# Patient Record
Sex: Male | Born: 1965 | Race: Black or African American | Hispanic: No | Marital: Single | Smoking: Current every day smoker
Health system: Southern US, Community
[De-identification: ages and names within clinical notes are randomized; demographics above are authoritative.]

## PROBLEM LIST (undated history)

## (undated) DIAGNOSIS — N200 Calculus of kidney: Secondary | ICD-10-CM

## (undated) DIAGNOSIS — E7800 Pure hypercholesterolemia, unspecified: Secondary | ICD-10-CM

## (undated) DIAGNOSIS — G8929 Other chronic pain: Secondary | ICD-10-CM

## (undated) DIAGNOSIS — I10 Essential (primary) hypertension: Secondary | ICD-10-CM

## (undated) HISTORY — DX: Other chronic pain: G89.29

## (undated) HISTORY — PX: BACK SURGERY: SHX140

## (undated) HISTORY — DX: Calculus of kidney: N20.0

---

## 2014-02-19 ENCOUNTER — Encounter (HOSPITAL_COMMUNITY): Payer: Self-pay | Admitting: Emergency Medicine

## 2014-02-19 ENCOUNTER — Emergency Department (HOSPITAL_COMMUNITY)
Admission: EM | Admit: 2014-02-19 | Discharge: 2014-02-19 | Disposition: A | Discharge status: Home / Self Care | Payer: Self-pay | Attending: Emergency Medicine | Admitting: Emergency Medicine

## 2014-02-19 DIAGNOSIS — S0180XA Unspecified open wound of other part of head, initial encounter: Secondary | ICD-10-CM | POA: Insufficient documentation

## 2014-02-19 DIAGNOSIS — F17200 Nicotine dependence, unspecified, uncomplicated: Secondary | ICD-10-CM | POA: Insufficient documentation

## 2014-02-19 DIAGNOSIS — IMO0002 Reserved for concepts with insufficient information to code with codable children: Secondary | ICD-10-CM | POA: Insufficient documentation

## 2014-02-19 DIAGNOSIS — Y929 Unspecified place or not applicable: Secondary | ICD-10-CM | POA: Insufficient documentation

## 2014-02-19 DIAGNOSIS — S0181XA Laceration without foreign body of other part of head, initial encounter: Secondary | ICD-10-CM

## 2014-02-19 DIAGNOSIS — S01511A Laceration without foreign body of lip, initial encounter: Secondary | ICD-10-CM

## 2014-02-19 DIAGNOSIS — S01501A Unspecified open wound of lip, initial encounter: Secondary | ICD-10-CM | POA: Insufficient documentation

## 2014-02-19 DIAGNOSIS — Y939 Activity, unspecified: Secondary | ICD-10-CM | POA: Insufficient documentation

## 2014-02-19 DIAGNOSIS — Z23 Encounter for immunization: Secondary | ICD-10-CM | POA: Insufficient documentation

## 2014-02-19 DIAGNOSIS — Y990 Civilian activity done for income or pay: Secondary | ICD-10-CM | POA: Insufficient documentation

## 2014-02-19 MED ORDER — TETANUS-DIPHTH-ACELL PERTUSSIS 5-2.5-18.5 LF-MCG/0.5 IM SUSP
0.5000 mL | Freq: Once | INTRAMUSCULAR | Status: AC
  Administered 2014-02-19: 0.5 mL via INTRAMUSCULAR
  Filled 2014-02-19: qty 0.5

## 2014-02-19 MED ORDER — NAPROXEN 500 MG PO TABS: 500.0000 mg | ORAL_TABLET | Freq: Two times a day (BID) | ORAL | Status: DC

## 2014-02-19 MED ORDER — OXYCODONE-ACETAMINOPHEN 5-325 MG PO TABS: 1.0000 | ORAL_TABLET | Freq: Four times a day (QID) | ORAL | Status: DC | PRN

## 2014-02-19 NOTE — Discharge Instructions (Signed)
Please read and follow all provided instructions.  Your diagnoses today include:  1. Facial laceration   2. Lip laceration     Tests performed today include:  Vital signs. See below for your results today.   Medications prescribed:   Percocet (oxycodone/acetaminophen) - narcotic pain medication  DO NOT drive or perform any activities that require you to be awake and alert because this medicine can make you drowsy. BE VERY CAREFUL not to take multiple medicines containing Tylenol (also called acetaminophen). Doing so can lead to an overdose which can damage your liver and cause liver failure and possibly death.   Naproxen - anti-inflammatory pain medication  Do not exceed 500mg  naproxen every 12 hours, take with food  You have been prescribed an anti-inflammatory medication or NSAID. Take with food. Take smallest effective dose for the shortest duration needed for your pain. Stop taking if you experience stomach pain or vomiting.   Take any prescribed medications only as directed.   Home care instructions:  Follow any educational materials and wound care instructions contained in this packet.   Keep affected area above the level of your heart when possible to minimize swelling. Wash area gently twice a day with warm soapy water. Do not apply alcohol or hydrogen peroxide. Cover the area if it draining or weeping.   Follow-up instructions: Suture Removal: Return to the Emergency Department or see your primary care care doctor in 5-7 days for a recheck of your wound and removal of your sutures or staples.    If you do not have a primary care doctor -- see below for referral information.   Return instructions:  Return to the Emergency Department if you have:  Fever  Worsening pain  Worsening swelling of the wound  Pus draining from the wound  Redness of the skin that moves away from the wound, especially if it streaks away from the affected area   Any other emergent  concerns  Your vital signs today were: BP 161/102   Pulse 77   Temp(Src) 98 F (36.7 C) (Oral)   Resp 18   Wt 210 lb 3.2 oz (95.346 kg)   SpO2 98% If your blood pressure (BP) was elevated above 135/85 this visit, please have this repeated by your doctor within one month. --------------  Emergency Department Resource Guide 1) Find a Doctor and Pay Out of Pocket Although you won't have to find out who is covered by your insurance plan, it is a good idea to ask around and get recommendations. You will then need to call the office and see if the doctor you have chosen will accept you as a new patient and what types of options they offer for patients who are self-pay. Some doctors offer discounts or will set up payment plans for their patients who do not have insurance, but you will need to ask so you aren't surprised when you get to your appointment.  2) Contact Your Local Health Department Not all health departments have doctors that can see patients for sick visits, but many do, so it is worth a call to see if yours does. If you don't know where your local health department is, you can check in your phone book. The CDC also has a tool to help you locate your state's health department, and many state websites also have listings of all of their local health departments.  3) Find a Walk-in Clinic If your illness is not likely to be very severe or complicated, you  may want to try a walk in clinic. These are popping up all over the country in pharmacies, drugstores, and shopping centers. They're usually staffed by nurse practitioners or physician assistants that have been trained to treat common illnesses and complaints. They're usually fairly quick and inexpensive. However, if you have serious medical issues or chronic medical problems, these are probably not your best option.  No Primary Care Doctor: - Call Health Connect at  [phone] - they can help you locate a primary care doctor that  accepts your  insurance, provides certain services, etc. - Physician Referral Service- [phone]  Chronic Pain Problems: Organization         Address  Phone   Notes  [facility]  [phone] Patients need to be referred by their primary care doctor.   Medication Assistance: Organization         Address  Phone   Notes  [facility] [address] [place], [place] [address] [phone] --Must be a resident of [facility] -- Must have NO insurance coverage whatsoever (no Medicaid/ Medicare, etc.) -- The pt. MUST have a primary care doctor that directs their care regularly and follows them in the community   [facility]ist  [phone]   [employer]  [phone]    Agencies that provide inexpensive medical care: Organization         Address  Phone   Notes  [place]  [phone]   [facility]    [phone]   [facility] [place], [place] [address] [phone]   [facility] [address], [place] [phone]   Planned Parenthood    [phone]   [facility]    [phone]   [address], [place] Phone:  [phone], Fax:  [phone] Hours of Operation:  9 am - 6 pm, M-F.  Also accepts Medicaid/Medicare and self-pay.  [facility] for [place], [address], [place] Phone: [phone], Fax: [phone]. Hours of Operation:  8:30 am - 5:30 pm, M-F.  Also accepts Medicaid and self-pay.  [facility] [place] [address], [place] Phone: [phone]   [place], [place], [place] [phone], Ext. [phone] Mondays & Thursdays: 7-9 AM.  First 15 patients are seen on a first come, first serve basis.    [place] Providers:  Organization          Address  Phone   Notes  [facility] [address], [place] [phone] Also accepts self-pay patients.  [facility] [id number] [place], [place]  [phone]   [place], [address], [place] [phone]   [facility] [address], [place] [phone]   Lucianne Lei [address], [place]   [phone] Only accepts [place] patients after they have their name applied to their card.   Self-Pay (no insurance) in [facility]:  Organization         Address  Phone   Notes  Sickle Cell Patients, [facility] [place] [phone]   [facility] [place] (  [phone]) [phone]   [facility] [place]  [address], [place] [phone]) [phone]   [facility]/Dr. Osei-Bonsu  [address] [place] [address], [place] or [address], [place] [phone]) [phone] Phone number for both [place] and [place] locations is the same.  [facility] [address], [place] [phone]) [phone]   [facility] [place] [address] [place] [address], [place] or [address] [phone]) [phone]) [phone]   [facility] [address], [place] [phone]) [phone], phone; [phone](336) [phone], fax Sees patients 1st and 3rd Saturday of every month.  Must not qualify for public or private insurance (i.e. Medicaid, Medicare, [place] Health Choice, Veterans' Benefits)  Household income should be no more than 200% of the poverty level The clinic cannot treat you if you are pregnant or think you are pregnant  Sexually transmitted diseases are not treated at the clinic.    Dental Care: Organization         Address  Phone  Notes  [facility] [address],  [place] [phone]) [phone] Accepts children up to age 47 who are enrolled in [place] or [place] Health Choice; pregnant women with a Medicaid card; and children who have applied for Medicaid or [place] Health Choice, but were declined, whose parents can pay a reduced fee at time of service.  [facility] [place]  [address], [place] [phone]) [phone] Accepts children up to age 47 who are enrolled in [place] or [place] Health Choice; pregnant women with a Medicaid card; and children who have applied for Medicaid or [place] Health Choice, but were declined, whose parents can pay a reduced fee at time of service.  [facility]  [address], [place] [phone]) [phone] Patients are seen by appointment only. Walk-ins are not accepted. [facility] will see patients 47 years of age and older. Monday - Tuesday (8am-5pm) Most Wednesdays (8:30-5pm) $30 per visit, cash only  [facility]  [address], [facility] [phone]) [phone] Patients are seen by appointment only. Walk-ins are not accepted. [facility] will see patients 10518 years of age and older. One Wednesday Evening (Monthly: Volunteer Based).  $30 per visit, cash only  [employer] [facility]  [phone] for adults; Children under age 244, call Graduate Pediatric Dentistry at [phone]) [phone]. Children aged 194-14, please call [phone] to request a pediatric application.  Dental services are provided in all areas of dental care including fillings, crowns and bridges, complete and partial dentures, implants, gum treatment, root canals, and extractions. Preventive care is also provided. Treatment is provided to both adults and children. Patients are selected via a lottery and there is often a waiting list.   [facility] [address], [place]  [phone]) [phone] [email or web address]   [facility] [address], [place], [place]  [phone], Ext. [phone] Second and Fourth Thursday of each month, opens at 6:30 AM; Clinic ends at 9 AM.  Patients are seen on a first-come first-served basis, and a limited number are seen during each clinic.   [facility]  [address], [place], [place] [phone]) [phone]   Eligibility Requirements You must have lived in [place], [place], or [place] counties for at least the last three months.   You cannot be eligible for  state or Teacher, music, including [employer], [place], or [employer].   You generally cannot be eligible for healthcare insurance through your employer.    How to apply: Eligibility screenings are held every Tuesday and Wednesday afternoon from 1:00 pm until 4:00 pm. You do not need an appointment for the interview!  [facility] [address] [place], [place] [phone]   [facility] Health Department  [phone]   [facility]  [phone]   [facility] Health Department  [phone]    Behavioral Health Resources in the Community: Intensive Outpatient Programs Organization         Address  Phone  Notes  [facility] Services [address] [place], [place] [phone]   [facility] [address], [place], [place] [phone]   [facility] [address], [place], [place]  [phone]   [facility] [address]  [place], [place] [phone] or [phone]   Substance Abuse Resources Organization         Address  Phone  Notes  Alcohol and Drug Services  [phone]   [facility]  [phone]   [facility] [place]  [phone]   Floydene Flock  [phone]   Residential & Outpatient Substance Abuse Program  [phone]   Psychological Services Organization         Address  Phone  Notes  [facility]  [phone]   [facility]  [phone]    [facility] [address] [place] [phone] or [phone]    Mobile Crisis Teams Organization         Address  Phone  Notes  [facility], Mobile Crisis Care Unit  [phone]   [facility]  [address] [place], [place] [phone]   Doristine Locks [address] [place] [phone]    Self-Help/Support Groups Organization         Address  Phone             Notes  [facility] [place] - variety of support groups  [phone]- I7437963 Call for more information  Narcotics Anonymous (NA), Caring [facility] [address], [employer] [place]  2 meetings at this location   Statistician         Address  Phone  Notes  [facility] [address],    [place]  [phone]   [facility]  [address] [place] [address], [place], [place] [phone]   [facility] [address] [place], [place] [phone] Admissions: 8am-3pm M-F  [facility] [address],    [place], [place] [phone]   [facility] [address] [place], [place] [phone]   [facility] [address]  [place], [place] [phone]   [facility] - Intensive Outpatient [address], [place], [place] [phone]   [facility] ([facility] [address] [place].,  [place], [place] [phone] or [phone]   [facility] Services (RTS) [address] [place], [place] [phone] Accepts Medicaid  [facility] [place] [address],  [place] [phone] Substance Abuse/Addiction Treatment   [facility] Organization         Address  Phone  Notes  [facility]  [phone]   Angie Fava, PhD [address] [place], [place]   (  [phone]) [phone] or [phone]) [phone]   [facility]   [address] [place], [place] [phone](336) [phone]   [facility] [address], [place], [place] [phone]) [phone] Insurance/Medicaid/sponsorship through [facility]  [facility] Families [address]                                    [place], [place] [phone]) [phone] Therapy/tele-psych/case  [facility] [address]   [place], [place] [phone]) [phone]    Dr. Lolly MustacheArfeen  [phone]) [phone]   [facility] [place]  [employer] [facility] 1) [address], [place] 2) [address], [place] 3)  [address], [place] [phone]) [phone]) [phone]  [phone](336) [phone]   [facility] Child Abuse Hotline [phone](336) 7745132976 or [phone](336) [phone] (After Hours)

## 2014-02-19 NOTE — ED Provider Notes (Signed)
CSN: [id number]     Arrival date & time 02/19/14  1354 History   First MD Initiated Contact with Patient 02/19/14 1402    This char t was scribed for Jeremy Smith, a non-physician practitioner working with Flint Melter, MD by Lewanda Rife, ED Scribe. This patient was seen in room TR06C/TR06C and the patient's care was started at 2:34 PM     Chief Complaint  Patient presents with  . Lip Laceration   (Consider location/radiation/quality/duration/timing/severity/associated sxs/prior Treatment) The history is provided by the patient. No language interpreter was used.   HPI Comments: Jeremy Smith is a 48 y.o. male who presents to the Emergency Department complaining of lip laceration onset acute PTA after a floor board struck him in the face. Reports constant moderate pain to site. Denies any aggravating or alleviating factors. Denies associated neck pain, LOC, head injury, headaches, and numbness. Tetanus status is not up to date.    History reviewed. No pertinent past medical history. Past Surgical History  Procedure Laterality Date  . Back surgery     History reviewed. No pertinent family history. History  Substance Use Topics  . Smoking status: Current Every Day Smoker  . Smokeless tobacco: Not on file  . Alcohol Use: No    Review of Systems  Constitutional: Negative for fever and fatigue.  HENT: Negative for tinnitus.   Eyes: Negative for photophobia, pain and visual disturbance.  Respiratory: Negative for shortness of breath.   Cardiovascular: Negative for chest pain.  Gastrointestinal: Negative for nausea and vomiting.  Musculoskeletal: Negative for back pain, gait problem and neck pain.  Skin: Positive for wound.  Neurological: Negative for dizziness, weakness, light-headedness, numbness and headaches.  Psychiatric/Behavioral: Negative for confusion and decreased concentration.    Allergies  Review of patient's allergies indicates no known allergies.  Home  Medications   Current Outpatient Rx  Name  Route  Sig  Dispense  Refill  . naproxen (NAPROSYN) 500 MG tablet   Oral   Take 1 tablet (500 mg total) by mouth 2 (two) times daily.   20 tablet   0   . oxyCODONE-acetaminophen (PERCOCET/ROXICET) 5-325 MG per tablet   Oral   Take 1-2 tablets by mouth every 6 (six) hours as needed for severe pain.   12 tablet   0    BP 161/102  Pulse 77  Temp(Src) 98 F (36.7 C) (Oral)  Resp 18  Wt 210 lb 3.2 oz (95.346 kg)  SpO2 98% Physical Exam  Nursing note and vitals reviewed. Constitutional: He is oriented to person, place, and time. He appears well-developed and well-nourished. No distress.  HENT:  Head: Normocephalic and atraumatic. Head is without raccoon's eyes and without Battle's sign.  Right Ear: Tympanic membrane, external ear and ear canal normal. No hemotympanum.  Left Ear: Tympanic membrane, external ear and ear canal normal. No hemotympanum.  Nose: Nose normal. No nasal septal hematoma.  Mouth/Throat: Oropharynx is clear and moist.  Full ROM jaw without pain. No point tenderness over mandible. Full ROM of facial muscles.   Eyes: Conjunctivae, EOM and lids are normal. Pupils are equal, round, and reactive to light.  No visible hyphema  Neck: Normal range of motion. Neck supple. No tracheal deviation present.  Cardiovascular: Normal rate and regular rhythm.   Pulmonary/Chest: Effort normal and breath sounds normal. No respiratory distress.  Abdominal: Soft. There is no tenderness.  Musculoskeletal: Normal range of motion. He exhibits no tenderness.       Cervical back:  He exhibits normal range of motion, no tenderness and no bony tenderness.       Thoracic back: He exhibits no tenderness and no bony tenderness.       Lumbar back: He exhibits no tenderness and no bony tenderness.  Neurological: He is alert and oriented to person, place, and time. He has normal strength and normal reflexes. No cranial nerve deficit or sensory  deficit. Coordination normal. GCS eye subscore is 4. GCS verbal subscore is 5. GCS motor subscore is 6.  Skin: Skin is warm and dry.  2cm L-shaped crossing vermilion border, not through and through, lower R lip, clean, hemostatic.  3cm crossing linear laceration, L chin, extends through subcutaneous tissue, no muscle or nerve involvement noted, clean, hemostatic.  Psychiatric: He has a normal mood and affect. His behavior is normal.    ED Course  Procedures (including critical care time) COORDINATION OF CARE:  Nursing notes reviewed. Vital signs reviewed. Initial pt interview and examination performed.   Filed Vitals:   02/19/14 1357  BP: 161/102  Pulse: 77  Temp: 98 F (36.7 C)  TempSrc: Oral  Resp: 18  Weight: 210 lb 3.2 oz (95.346 kg)  SpO2: 98%    2:14 PM-Discussed work up plan with pt at bedside. Pt agrees with plan.   LACERATION REPAIR Performed by: Rhea BleacherJosh Griselle Rufer PA-C Consent: Verbal consent obtained. Risks and benefits: risks, benefits and alternatives were discussed Patient identity confirmed: provided demographic data Time out performed prior to procedure Prepped and Draped in normal sterile fashion Wound explored Laceration Location: L-shape laceration crossing [place] border lower right lip  Laceration Length: 2 cm No Foreign Bodies seen or palpated Anesthesia: local infiltration Local anesthetic: lidocaine 2% with epinephrine Anesthetic total: 2 ml Irrigation method: syringe Amount of cleaning: standard Skin closure: prolene 5-0 Number of sutures or staples: 8 sutures Technique: simple interrupted  Patient tolerance: Patient tolerated the procedure well with no immediate complications.  LACERATION REPAIR Performed by: Rhea BleacherJosh Timara Loma PA-C Consent: Verbal consent obtained. Risks and benefits: risks, benefits and alternatives were discussed Patient identity confirmed: provided demographic data Time out performed prior to procedure Prepped and Draped in  normal sterile fashion Wound explored Laceration Location: Left chin Laceration Length: 3 cm No Foreign Bodies seen or palpated Anesthesia: local infiltration Local anesthetic: lidocaine 2% with epinephrine Anesthetic total: 4 ml Irrigation method: syringe Amount of cleaning: standard Skin closure: prolene 5-0 Number of sutures or staples: 10 sutures  Technique: simple interrupted Patient tolerance: Patient tolerated the procedure well with no immediate complications.  Wound(s) explored with adequate hemostasis, no apparent gross foreign body retained, no significant involvement of deep structures such as bone / joint / tendon / or neurovascular involvement noted. Initial diagnostic testing ordered.     Labs Review Labs Reviewed - No data to display Imaging Review No results found.   EKG Interpretation None      Patient counseled on wound care. Patient counseled on need to return or see PCP/urgent care for suture removal in 5-7 days. Patient was urged to return to the Emergency Department urgently with worsening pain, swelling, expanding erythema especially if it streaks away from the affected area, fever, or if they have any other concerns. Patient verbalized understanding.   Patient counseled on use of narcotic pain medications. Counseled not to combine these medications with others containing tylenol. Urged not to drink alcohol, drive, or perform any other activities that requires focus while taking these medications. The patient verbalizes understanding and agrees with the  plan.  Tetanus updated.    MDM   Final diagnoses:  Facial laceration  Lip laceration   Facial laceration. No closed head injury suspected. No neuro deficits. Lacerations repaired with good approximation. Do not suspect significant bony, muscle, nerve, or vasculature injury. No jaw injury suspected.    I personally performed the services described in this documentation, which was scribed in my  presence. The recorded information has been reviewed and is accurate.    Renne Crigler, PA-C 02/19/14 [phone]

## 2014-02-19 NOTE — ED Notes (Signed)
While at work, a board popped up, striking pt in the chin and mouth. Deep lac through vermilion border of right lower lip and second lac to chin.

## 2014-02-25 NOTE — ED Provider Notes (Signed)
Medical screening examination/treatment/procedure(s) were performed by non-physician practitioner and as supervising physician I was immediately available for consultation/collaboration.  Pedram Goodchild L Alaiya Martindelcampo, MD 02/25/14 0954 

## 2014-03-01 ENCOUNTER — Emergency Department (HOSPITAL_COMMUNITY)
Admission: EM | Admit: 2014-03-01 | Discharge: 2014-03-01 | Disposition: A | Discharge status: Home / Self Care | Payer: Self-pay | Attending: Emergency Medicine | Admitting: Emergency Medicine

## 2014-03-01 ENCOUNTER — Encounter (HOSPITAL_COMMUNITY): Payer: Self-pay | Admitting: Emergency Medicine

## 2014-03-01 DIAGNOSIS — R52 Pain, unspecified: Secondary | ICD-10-CM | POA: Insufficient documentation

## 2014-03-01 DIAGNOSIS — G8929 Other chronic pain: Secondary | ICD-10-CM | POA: Insufficient documentation

## 2014-03-01 DIAGNOSIS — Z4802 Encounter for removal of sutures: Secondary | ICD-10-CM | POA: Insufficient documentation

## 2014-03-01 DIAGNOSIS — M545 Low back pain, unspecified: Secondary | ICD-10-CM | POA: Insufficient documentation

## 2014-03-01 DIAGNOSIS — Z9889 Other specified postprocedural states: Secondary | ICD-10-CM | POA: Insufficient documentation

## 2014-03-01 DIAGNOSIS — Z791 Long term (current) use of non-steroidal anti-inflammatories (NSAID): Secondary | ICD-10-CM | POA: Insufficient documentation

## 2014-03-01 DIAGNOSIS — F17200 Nicotine dependence, unspecified, uncomplicated: Secondary | ICD-10-CM | POA: Insufficient documentation

## 2014-03-01 MED ORDER — DIAZEPAM 5 MG PO TABS
5.0000 mg | ORAL_TABLET | Freq: Once | ORAL | Status: AC
  Administered 2014-03-01: 5 mg via ORAL
  Filled 2014-03-01: qty 1

## 2014-03-01 MED ORDER — HYDROCODONE-ACETAMINOPHEN 5-325 MG PO TABS
1.0000 | ORAL_TABLET | Freq: Once | ORAL | Status: AC
  Administered 2014-03-01: 1 via ORAL
  Filled 2014-03-01: qty 1

## 2014-03-01 MED ORDER — DIAZEPAM 5 MG PO TABS: 5.0000 mg | ORAL_TABLET | Freq: Two times a day (BID) | ORAL | Status: DC

## 2014-03-01 MED ORDER — IBUPROFEN 800 MG PO TABS: 800.0000 mg | ORAL_TABLET | Freq: Three times a day (TID) | ORAL | Status: AC

## 2014-03-01 MED ORDER — HYDROCODONE-ACETAMINOPHEN 5-325 MG PO TABS: 1.0000 | ORAL_TABLET | ORAL | Status: DC | PRN

## 2014-03-01 MED ORDER — KETOROLAC TROMETHAMINE 60 MG/2ML IM SOLN
60.0000 mg | Freq: Once | INTRAMUSCULAR | Status: AC
  Administered 2014-03-01: 60 mg via INTRAMUSCULAR
  Filled 2014-03-01: qty 2

## 2014-03-01 NOTE — ED Provider Notes (Signed)
CSN: [id number]     Arrival date & time 03/01/14  1456 History  This chart was scribed for non-physician practitioner, Kyung BaccaKatie Yair Dusza, PA-C working with Glynn OctaveStephen Rancour, MD by Luisa DagoPriscilla Tutu, ED scribe. This patient was seen in room TR08C/TR08C and the patient's care was started at 4:41 PM.    Chief Complaint  Patient presents with  . Back Pain  . Suture / Staple Removal   The history is provided by the patient. No language interpreter was used.   HPI Comments: [name] is a 48 y.o. male who presents to the Emergency Department complaining of worsening lower back pain that started after he bent down to pick up heavy object yesterday. Pt also needs suture removal. He had 8 sutures placed in his upper lip 10 days ago, pt states that he was helping a friend when he injured himself on a nearby object. Pt states that he has a history of back surgery where he had the disc from his L5-S1 removed. He states that following the surgery he has been experiencing paraesthesia in his left leg. He states that the pain is located in the same area where he had his back surgery. Mr. Kary KosHummel denies taking any OTC medications to relieve his symptoms. Pt does not have a PCP and states that he does not have the means to see a neurologist at this moment. He states that he is in the process of getting his insurance reinstated. Denies any abdominal pain, nausea, emesis, congestion, fever, diarrhea, constipation, SOB, or chest pain.   Pt states that his wound is healing well. He denies any associated drainage, pain, or swelling. Pt denies any pertinent medical history.  History reviewed. No pertinent past medical history. Past Surgical History  Procedure Laterality Date  . Back surgery     History reviewed. No pertinent family history. History  Substance Use Topics  . Smoking status: Current Every Day Smoker  . Smokeless tobacco: Not on file  . Alcohol Use: Yes     Comment: occ    Review of Systems   Musculoskeletal: Positive for back pain.  Skin: Positive for wound (healing).  All other systems reviewed and are negative.  Allergies  Review of patient's allergies indicates no known allergies.  Home Medications   Current Outpatient Rx  Name  Route  Sig  Dispense  Refill  . naproxen (NAPROSYN) 500 MG tablet   Oral   Take 1 tablet (500 mg total) by mouth 2 (two) times daily.   20 tablet   0   . oxyCODONE-acetaminophen (PERCOCET/ROXICET) 5-325 MG per tablet   Oral   Take 1-2 tablets by mouth every 6 (six) hours as needed for severe pain.   12 tablet   0    BP 144/86  Pulse 72  Temp(Src) 98.9 F (37.2 C) (Oral)  Resp 18  SpO2 99%  Physical Exam  Nursing note and vitals reviewed. Constitutional: He is oriented to person, place, and time. He appears well-developed and well-nourished. No distress.  HENT:  Head: Normocephalic and atraumatic.  7 sutures in place R lip.  9 sutures in place right chin.  Wound healing well.  No erythema, edema, drainage, ttp.   Eyes:  Normal appearance  Neck: Normal range of motion.  Cardiovascular: Normal rate and regular rhythm.   Pulmonary/Chest: Effort normal and breath sounds normal.  Genitourinary:  No CVA ttp  Musculoskeletal: Normal range of motion.  Lumbar spine non-tender.  Diffuse L lumbar soft tissue ttp. Full active ROM  of LE.  Nml patellar reflexes.  No saddle anesthesia. Distal sensation intact.  2+ DP pulses.  Ambulates w/out diffulty.   Neurological: He is alert and oriented to person, place, and time.  Skin: Skin is warm and dry. No rash noted.  Psychiatric: He has a normal mood and affect. His behavior is normal.    ED Course  SUTURE REMOVAL Date/Time: 03/01/2014 5:14 PM Performed by: Otilio MiuSCHINLEVER, Shaindy Reader E Authorized by: Ruby ColaSCHINLEVER, Melecio Cueto E Consent: Verbal consent obtained. Consent given by: patient Body area: head/neck Location details: chin Wound Appearance: clean Sutures Removed: 16 Patient tolerance:  Patient tolerated the procedure well with no immediate complications.   (including critical care time)   DIAGNOSTIC STUDIES: Oxygen Saturation is 99% on RA, normal by my interpretation.    COORDINATION OF CARE: 4:47 PM- Will give pt a referral to a neurologist. Will also order pain medication.  Pt advised of plan for treatment and pt agrees.  Labs Review Labs Reviewed - No data to display Imaging Review No results found.   EKG Interpretation None      MDM   Final diagnoses:  Low back pain  Visit for suture removal    47yo M presents w/ acute on chronic L low back pain since lifting heavy object yesterday.  Characteristics of pain including associated LLE paresthesias are typical.  Afebrile, NAD, ambulatory, no bony tenderness, no NV deficits BLE on exam.  Will treat symptomatically w/ vicodin, valium and toradol, for what is likely a lumbar strain.  Will refer to NS as well because he has had gradually worsening pain on daily basis since remote lumbar [place].  Also requests suture removal from R lower lip and chin.  Wound seems to be healing well and there are no signs of infection. Return precautions discussed.   I personally performed the services described in this documentation, which was scribed in my presence. The recorded information has been reviewed and is accurate.  Arie SabinaCatherine E Grady Mohabir, PA-C 03/02/14 1013

## 2014-03-01 NOTE — ED Notes (Signed)
[place]x of [place]. Lifted heavy object 2 days ago and started with LBP. States has had left leg numbness and weakness since previous sx. ALSO, here for suture removal, right upper lip from 4/3.

## 2014-03-01 NOTE — Discharge Instructions (Signed)
Take vicodin as needed for severe pain and valium as needed for spasm.   Do not drive within four hours of taking these medications (may cause drowsiness or confusion).  Take ibuprofen as well; up to 800mg  three times a day with food.  Apply a heating pad or ice pack to lower back multiple times a day and Avoid activities that aggravate pain.   Follow up with the neurosurgeon you have been referred to or use resource guide below to find a primary care doctor.  You should return to the ER if you develop change in or worsening of pain, fever (100.5 degrees or greater), inability to walk due to leg weakness or loss of control of bladder/bowels.     Emergency Department Resource Guide 1) Find a Doctor and Pay Out of Pocket Although you won't have to find out who is covered by your insurance plan, it is a good idea to ask around and get recommendations. You will then need to call the office and see if the doctor you have chosen will accept you as a new patient and what types of options they offer for patients who are self-pay. Some doctors offer discounts or will set up payment plans for their patients who do not have insurance, but you will need to ask so you aren't surprised when you get to your appointment.  2) Contact Your Local Health Department Not all health departments have doctors that can see patients for sick visits, but many do, so it is worth a call to see if yours does. If you don't know where your local health department is, you can check in your phone book. The CDC also has a tool to help you locate your state's health department, and many state websites also have listings of all of their local health departments.  3) Find a Walk-in Clinic If your illness is not likely to be very severe or complicated, you may want to try a walk in clinic. These are popping up all over the country in pharmacies, drugstores, and shopping centers. They're usually staffed by nurse practitioners or physician  assistants that have been trained to treat common illnesses and complaints. They're usually fairly quick and inexpensive. However, if you have serious medical issues or chronic medical problems, these are probably not your best option.  No Primary Care Doctor: - Call Health Connect at  [phone] - they can help you locate a primary care doctor that  accepts your insurance, provides certain services, etc. - Physician Referral Service- [phone]  Chronic Pain Problems: Organization         Address  Phone   Notes  [facility]  [phone] Patients need to be referred by their primary care doctor.   Medication Assistance: Organization         Address  Phone   Notes  [facility] Medication [facility] [address] [place] [place], [place] [address] [phone] --Must be a resident of [facility] -- Must have NO insurance coverage whatsoever (no Medicaid/ Medicare, etc.) -- The pt. MUST have a primary care doctor that directs their care regularly and follows them in the community   [facility]  [phone]   [employer]  [phone]    Agencies that provide inexpensive medical care: Organization         Address  Phone   Notes  [facility]  [phone]   [facility]    [phone]  [phone]   [facility] [address] [place], [place] [address] [phone]   [facility] [address] [place] [place] [phone]   Planned Parenthood    [phone]   [facility]    [phone]   [facility]  [address], [place] Phone:  [phone], Fax:  [phone] Hours of Operation:  9 am - 6 pm, M-F.  Also accepts Medicaid/Medicare and self-pay.  [facility]  [address], [place] Phone: [phone], Fax: [phone]. Hours of Operation:  8:30 am - 5:30 pm, M-F.  Also accepts  Medicaid and self-pay.  [facility] [place] [address] [place] Phone: [phone]   [facility] [address] Natasha Bence [place], [place] [phone], Ext. [phone] Mondays & Thursdays: 7-9 AM.  First 15 patients are seen on a first come, first serve basis.    Medicaid-accepting [facility] Providers:  Organization         Address  Phone   Notes  [facility] [address], [place] [phone] Also accepts self-pay patients.  [facility] [address] Laurell Josephs [place], [place]  [phone]   [facility] [address], [place] [phone]   [facility] [address] [place] [phone]   Renaye Rakers [address], [place]   [phone] Only accepts [place] patients after they have their name applied to their card.   Self-Pay (no insurance) in [facility]:  Organization         Address  Phone   Notes  Sickle Cell Patients, [facility] Internal Medicine [address] [place], [place] [phone]   [facility] [address] [place], [place] [phone]   [facility] [place]  [address], [place] [phone]   [facility]/Dr. Osei-Bonsu  [address], [place] or [address], [place] [phone] Phone number for both [place] and [place] locations is the same.  [facility] [address] [place] [phone]   [facility] [address] [place] or [address] [phone]   [facility] [address], [place] [phone], phone; [phone], fax Sees patients 1st and 3rd Saturday of every month.  Must not qualify for public or private insurance (i.e. Medicaid, Medicare, [place] Health Choice, Veterans' Benefits)  Household  income should be no more than 200% of the poverty level The clinic cannot treat you if you are pregnant or think you are pregnant  Sexually transmitted diseases are not treated at the clinic.    Dental Care: Organization         Address  Phone  Notes  [facility] Department of [facility] [address] [place], [place] [phone] Accepts children up to age 58 who are enrolled in [place] or [place] Health Choice; pregnant women with a Medicaid card; and children who have applied for Medicaid or  Health Choice, but were declined, whose parents can pay a reduced fee at time of service.  [facility] Department of [facility]  [address], [place] [phone] Accepts children up to  age 521 who are enrolled in Medicaid or [place] Health Choice; pregnant women with a Medicaid card; and children who have applied for Medicaid or [place] Health Choice, but were declined, whose parents can pay a reduced fee at time of service.  [facility]  [address], [place] [phone] Patients are seen by appointment only. Walk-ins are not accepted. [facility] will see patients 47 years of age and older. Monday - Tuesday (8am-5pm) Most Wednesdays (8:30-5pm) $30 per visit, cash only  [facility]  [address], [place] [facility] [phone] Patients are seen by appointment only. Walk-ins are not accepted. [facility] will see patients 47 years of age and older. One Wednesday Evening (Monthly: Volunteer Based).  $30 per visit, cash only  [employer] [facility] [facility]  [phone]) [phone] for adults; Children under age 264, call Graduate Pediatric Dentistry at [phone]) [phone]. Children aged 694-14, please call [phone]) [phone] to request a pediatric application.  Dental services are provided in all areas of dental care including fillings, crowns and bridges, complete and partial dentures, implants, gum  treatment, root canals, and extractions. Preventive care is also provided. Treatment is provided to both adults and children. Patients are selected via a lottery and there is often a waiting list.   [facility] [address], [place]  [phone] [email or web address]   [facility] [address], [place], [place] [phone], Ext. [phone] Second and Fourth Thursday of each month, opens at 6:30 AM; Clinic ends at 9 AM.  Patients are seen on a first-come first-served basis, and a limited number are seen during each clinic.   [facility]  [address], [place], [place] [phone]   Eligibility Requirements You must have lived in [place], [place], or [place] counties for at least the last three months.   You cannot be eligible for state or federal sponsored National Cityhealthcare insurance, including CIGNAVeterans Administration, [place]icaid, or [employer]icare.   You generally cannot be eligible for healthcare insurance through your employer.    How to apply: Eligibility screenings are held every Tuesday and Wednesday afternoon from 1:00 pm until 4:00 pm. You do not need an appointment for the interview!  [facility] [address], [place], [place] [phone]   [facility]  [phone]   [facility]  [phone]   [facility]  [phone]    Behavioral Health Resources in the Community: Intensive Outpatient Programs Organization         Address  Phone  Notes  [facility] [address], [place], [place] [phone]   [facility] Outpatient [address], [place], [place] [phone]   [facility] [address], [place], [place]  [phone]   [facility] [address],  [place], [place] [phone] or [phone]   Substance Abuse Resources Organization         Address  Phone  Notes  Alcohol and  Drug Services  [phone]   [facility]  [phone]   [facility]  [phone]   [facility]  [phone]   Residential & Outpatient Substance Abuse Program  [phone]   Psychological Services Organization         Address  Phone  Notes  [facility]  [phone]- [phone]   [facility]  [phone]- [phone]   [facility] [phone]  [address], [place] [phone] or [phone]    Mobile Crisis Teams Organization         Address  Phone  Notes  [facility], Mobile Crisis Care Unit  [phone]   [facility]  [address] [place], [place] [phone]   Doristine LocksSharon DeEsch [address] [place] [phone]    Self-Help/Support Groups Organization         Address  Phone             Notes  [facility]ental Health Assoc[facility] of [place] - variety of support groups  336- [phone] Call for more information  Narcotics Anonymous (NA), Caring [facility] [address], [place]  2 meetings at this location   Statisticianesidential Treatment Programs Organization         Address  Phone  Notes  [facility] [address],    [place]  [phone]   [facility]  [address], [place] [phone], [place], [place] [phone]   [facility] [address], [place] [phone] Admissions: 8am-3pm M-F  [facility] [address]    [place], [place] [phone]   [facility] [address], [place], [place] [phone]   [facility] [address]  [place], [place] [phone]   [facility] - Intensive Outpatient [address], [place], [place] [phone]   [facility] [address]  [place], [place] [phone] or [phone]   [facility] ([facility]) [address] [place], [place] [phone] Accepts Medicaid  Fellowship  [facility] [address]  [place] [phone] Substance Abuse/Addiction Treatment   [facility] Resources Organization         Address  Phone  Notes  [facility]  [phone]) [phone]   Angie FavaJulie Brannon, PhD [address], [address] [place], [place]   [phone]) [phone] or [phone]   [facility] [place]   [address] [place], [place] [phone](336) [phone]   [facility] [address], [place], [place] [phone] Insurance/Medicaid/sponsorship through [facility]  Faith [facility] Families [address]                                    [place], [place] [phone]) [phone] Therapy/tele-psych/case  [facility] [address].   [place], [place] [phone]) [phone]    Dr. Lolly MustacheArfeen  [phone]) [phone]   [facility] [place]  [employer] [facility] 1) [address], [place] 2) [address], [place] 3)  [address], [place] [phone](336) [phone](336) [phone]  [phone]   [facility] Child Abuse Hotline [phone](336) [phone] or [phone](336) [phone] (After Hours)

## 2014-03-01 NOTE — ED Notes (Signed)
Pt here for lower back pain after bending over to pick something up; pt sts hx of back sx; pt also requests suture removal from right side of lip

## 2014-03-02 NOTE — ED Provider Notes (Signed)
Medical screening examination/treatment/procedure(s) were performed by non-physician practitioner and as supervising physician I was immediately available for consultation/collaboration.   EKG Interpretation None       Brazos Sandoval, MD 03/02/14 1048 

## 2015-05-24 ENCOUNTER — Emergency Department (HOSPITAL_COMMUNITY): Payer: Self-pay

## 2015-05-24 ENCOUNTER — Emergency Department (HOSPITAL_COMMUNITY)
Admission: EM | Admit: 2015-05-24 | Discharge: 2015-05-24 | Disposition: A | Discharge status: Home / Self Care | Payer: Self-pay | Attending: Emergency Medicine | Admitting: Emergency Medicine

## 2015-05-24 ENCOUNTER — Encounter (HOSPITAL_COMMUNITY): Payer: Self-pay | Admitting: *Deleted

## 2015-05-24 DIAGNOSIS — I10 Essential (primary) hypertension: Secondary | ICD-10-CM | POA: Insufficient documentation

## 2015-05-24 DIAGNOSIS — Z720 Tobacco use: Secondary | ICD-10-CM | POA: Insufficient documentation

## 2015-05-24 DIAGNOSIS — R109 Unspecified abdominal pain: Secondary | ICD-10-CM

## 2015-05-24 DIAGNOSIS — N201 Calculus of ureter: Secondary | ICD-10-CM | POA: Insufficient documentation

## 2015-05-24 DIAGNOSIS — Z79899 Other long term (current) drug therapy: Secondary | ICD-10-CM | POA: Insufficient documentation

## 2015-05-24 DIAGNOSIS — Z8639 Personal history of other endocrine, nutritional and metabolic disease: Secondary | ICD-10-CM | POA: Insufficient documentation

## 2015-05-24 DIAGNOSIS — Z791 Long term (current) use of non-steroidal anti-inflammatories (NSAID): Secondary | ICD-10-CM | POA: Insufficient documentation

## 2015-05-24 HISTORY — DX: Pure hypercholesterolemia, unspecified: E78.00

## 2015-05-24 HISTORY — DX: Essential (primary) hypertension: I10

## 2015-05-24 LAB — BASIC METABOLIC PANEL
Anion gap: 11 (ref 5–15)
BUN: 17 mg/dL (ref 6–20)
CO2: 25 mmol/L (ref 22–32)
Calcium: 10 mg/dL (ref 8.9–10.3)
Chloride: 106 mmol/L (ref 101–111)
Creatinine, Ser: 1.2 mg/dL (ref 0.61–1.24)
GFR calc Af Amer: >60 mL/min (ref >60)
GFR calc non Af Amer: >60 mL/min (ref >60)
Glucose, Bld: 101 mg/dL — ABNORMAL HIGH (ref 65–99)
Potassium: 4 mmol/L (ref 3.5–5.1)
Sodium: 142 mmol/L (ref 135–145)

## 2015-05-24 LAB — CBC
HCT: 43.3 % (ref 39.0–52.0)
Hemoglobin: 14.4 g/dL (ref 13.0–17.0)
MCH: 29.3 pg (ref 26.0–34.0)
MCHC: 33.3 g/dL (ref 30.0–36.0)
MCV: 88.2 fL (ref 78.0–100.0)
Platelets: 211 10*3/uL (ref 150–400)
RBC: 4.91 MIL/uL (ref 4.22–5.81)
RDW: 13.1 % (ref 11.5–15.5)
WBC: 12.5 10*3/uL — ABNORMAL HIGH (ref 4.0–10.5)

## 2015-05-24 LAB — URINE MICROSCOPIC-ADD ON

## 2015-05-24 LAB — URINALYSIS, ROUTINE W REFLEX MICROSCOPIC
Bilirubin Urine: NEGATIVE
Glucose, UA: NEGATIVE mg/dL
Ketones, ur: NEGATIVE mg/dL
Leukocytes, UA: NEGATIVE
Nitrite: NEGATIVE
Protein, ur: NEGATIVE mg/dL
Specific Gravity, Urine: 1.017 (ref 1.005–1.030)
Urobilinogen, UA: 0.2 mg/dL (ref 0.0–1.0)
pH: 7 (ref 5.0–8.0)

## 2015-05-24 MED ORDER — OXYCODONE-ACETAMINOPHEN 5-325 MG PO TABS: 1.0000 | ORAL_TABLET | Freq: Four times a day (QID) | ORAL | Status: DC | PRN

## 2015-05-24 MED ORDER — HYDROMORPHONE HCL 1 MG/ML IJ SOLN
1.0000 mg | Freq: Once | INTRAMUSCULAR | Status: AC
  Administered 2015-05-24: 1 mg via INTRAVENOUS
  Filled 2015-05-24: qty 1

## 2015-05-24 MED ORDER — KETOROLAC TROMETHAMINE 30 MG/ML IJ SOLN
30.0000 mg | Freq: Once | INTRAMUSCULAR | Status: AC
  Administered 2015-05-24: 30 mg via INTRAVENOUS
  Filled 2015-05-24: qty 1

## 2015-05-24 MED ORDER — TAMSULOSIN HCL 0.4 MG PO CAPS: 0.4000 mg | ORAL_CAPSULE | Freq: Every day | ORAL | Status: DC

## 2015-05-24 MED ORDER — ONDANSETRON HCL 4 MG/2ML IJ SOLN
4.0000 mg | Freq: Once | INTRAMUSCULAR | Status: AC
  Administered 2015-05-24: 4 mg via INTRAVENOUS
  Filled 2015-05-24: qty 2

## 2015-05-24 MED ORDER — FENTANYL CITRATE (PF) 100 MCG/2ML IJ SOLN
50.0000 ug | Freq: Once | INTRAMUSCULAR | Status: AC
  Administered 2015-05-24: 50 ug via INTRAVENOUS
  Filled 2015-05-24: qty 2

## 2015-05-24 NOTE — ED Notes (Signed)
Questions r/t dc denied. Pt will have family mbr drive him home. Pt a&ox4

## 2015-05-24 NOTE — ED Notes (Signed)
EMS contacted by patient d/t increased pain in abdomen and left flank area since 7 am. Pain is progressively worse. Other assessment data unremarkable. Pt ambulatory and a&ox4. Hx htn hyperlipidemia. Pt has been noncompliant with medications

## 2015-05-24 NOTE — ED Provider Notes (Signed)
CSN: [id number]     Arrival date & time 05/24/15  1209 History   First MD Initiated Contact with Patient 05/24/15 1247     Chief Complaint  Patient presents with  . Abdominal Pain  . Flank Pain     (Consider location/radiation/quality/duration/timing/severity/associated sxs/prior Treatment) HPI Patient presents to the emergency department with left flank pain that started early this morning.  Patient states that it has gotten progressively worse as the days gone on, he had associated nausea but no vomiting.  The patient denies chest pain, shortness breath, weakness, dizziness, dysuria, incontinence, hematuria, bloody stool, fever, lethargy, lightheadedness, near syncope or syncope.  The patient states that nothing seems to make his condition, better or worse.  Patient did not take any medications prior to arrival.  He denies any previous medical conditions for which he is treated on a regular basis Past Medical History  Diagnosis Date  . Hypertension   . High cholesterol    Past Surgical History  Procedure Laterality Date  . Back surgery     No family history on file. History  Substance Use Topics  . Smoking status: Current Every Day Smoker -- 0.50 packs/day    Types: Cigarettes  . Smokeless tobacco: Not on file  . Alcohol Use: Yes     Comment: occ    Review of Systems  All other systems negative except as documented in the HPI. All pertinent positives and negatives as reviewed in the HPI.  Allergies  Review of patient's allergies indicates no known allergies.  Home Medications   Prior to Admission medications   Medication Sig Start Date End Date Taking? Authorizing Provider  diphenhydramine-acetaminophen (TYLENOL PM) 25-500 MG TABS Take 1-2 tablets by mouth at bedtime as needed (sleep).   Yes Historical Provider, MD  diazepam (VALIUM) 5 MG tablet Take 1 tablet (5 mg total) by mouth 2 (two) times daily. Patient not taking: Reported on 05/24/2015 03/01/14   Ruby Colaatherine  Schinlever, PA-C  HYDROcodone-acetaminophen (NORCO/VICODIN) 5-325 MG per tablet Take 1 tablet by mouth every 4 (four) hours as needed for moderate pain. Patient not taking: Reported on 05/24/2015 03/01/14   Ruby Colaatherine Schinlever, PA-C  ibuprofen (ADVIL,MOTRIN) 800 MG tablet Take 1 tablet (800 mg total) by mouth 3 (three) times daily. Patient not taking: Reported on 05/24/2015 03/01/14   Ruby Colaatherine Schinlever, PA-C  naproxen (NAPROSYN) 500 MG tablet Take 500 mg by mouth daily as needed. Back pain 02/19/14   Renne CriglerJoshua Geiple, PA-C  oxyCODONE-acetaminophen (PERCOCET/ROXICET) 5-325 MG per tablet Take 1-2 tablets by mouth every 6 (six) hours as needed for severe pain. Patient not taking: Reported on 05/24/2015 02/19/14   Renne CriglerJoshua Geiple, PA-C   BP 175/109 mmHg  Pulse 78  Temp(Src) 97.7 F (36.5 C) (Oral)  Resp 24  SpO2 99% Physical Exam  Constitutional: He is oriented to person, place, and time. He appears well-developed and well-nourished. No distress.  HENT:  Head: Normocephalic and atraumatic.  Mouth/Throat: Oropharynx is clear and moist.  Eyes: Pupils are equal, round, and reactive to light.  Neck: Normal range of motion. Neck supple.  Cardiovascular: Normal rate, regular rhythm and normal heart sounds.  Exam reveals no gallop and no friction rub.   No murmur heard. Pulmonary/Chest: Effort normal and breath sounds normal.  Abdominal: Soft. Bowel sounds are normal. He exhibits no distension. There is no tenderness.  Neurological: He is alert and oriented to person, place, and time. He exhibits normal muscle tone. Coordination normal.  Skin: Skin is warm and dry. No  rash noted. No erythema.  Psychiatric: He has a normal mood and affect. His behavior is normal.  Nursing note and vitals reviewed.   ED Course  Procedures (including critical care time) Labs Review Labs Reviewed  URINALYSIS, ROUTINE W REFLEX MICROSCOPIC (NOT AT [facility]) - Abnormal; Notable for the following:    APPearance CLOUDY (*)    Hgb  urine dipstick LARGE (*)    All other components within normal limits  CBC - Abnormal; Notable for the following:    WBC 12.5 (*)    All other components within normal limits  BASIC METABOLIC PANEL - Abnormal; Notable for the following:    Glucose, Bld 101 (*)    All other components within normal limits  URINE MICROSCOPIC-ADD ON    Imaging Review Ct Renal Stone Study  05/24/2015   CLINICAL DATA:  One day history of left flank pain  EXAM: CT ABDOMEN AND PELVIS WITHOUT CONTRAST  TECHNIQUE: Multidetector CT imaging of the abdomen and pelvis was performed following the standard protocol without oral or intravenous contrast material administration.  COMPARISON:  None.  FINDINGS: Lung bases are clear.  No focal liver lesions are identified on this noncontrast enhanced study. Gallbladder wall is not appreciably thickened. There is no biliary duct dilatation.  Spleen, pancreas, and adrenals appear normal.  In the right kidney, there are three, 1-2 mm calculi in the upper to midpole region, nonobstructing. There is a 2 mm calculus in the right lower pole with a nearby 1 mm calculus in the lower pole right kidney. There is no right renal mass or hydronephrosis. There is no right-sided ureteral calculus. On the left, there is a 2 mm calculus with a nearby 1 mm calculus in the upper to mid portion. There is a tiny calculus in the midportion kidney as well as a 4 x 2 mm calculus in the lower pole region. More inferiorly on the left,, there are two, 1 mm calculi. There is no left renal mass. There is mild hydronephrosis on the left. There is a 3 mm calculus in the left ureter at the level of L5. No other ureteral calculi are identified.  In the pelvis, the urinary bladder is midline with normal wall thickness. There are several prostatic calculi. There is no pelvic mass or pelvic fluid collection. The appendix appears normal.  There is no bowel obstruction. There is no free air or portal venous air. There is no  demonstrable ascites, adenopathy, or abscess in the abdomen or pelvis. There is no abdominal aortic aneurysm. There is degenerative change in the lumbar spine with fairly marked narrowing at L5-S1 with vacuum phenomenon at this level. There are no blastic or lytic bone lesions.  IMPRESSION: 3 mm calculus in the left ureter at the level of L5 causing mild hydronephrosis. There are multiple nonobstructing calculi in each kidney. There are multiple prostatic calculi.  No bowel obstruction.  No abscess.  Appendix appears normal.   Electronically Signed   By: Bretta Bang III M.D.   On: 05/24/2015 14:02    Patient has a left ureteral stone.  Patient will be given follow-up with urology.  Told to return here as needed.  Patient is feeling better at this time.  Told to return here as needed.   Charlestine Night, PA-C 05/24/15 1526  Lorre Nick, MD 05/27/15 [phone]

## 2015-05-24 NOTE — Discharge Instructions (Signed)
Return here as needed.  Follow-up the urologist provided

## 2015-05-24 NOTE — ED Notes (Signed)
Bed: WA08 Expected date:  Expected time:  Means of arrival: Ambulance Comments: 49 y/o Male LLQ pain

## 2016-06-08 ENCOUNTER — Encounter (HOSPITAL_BASED_OUTPATIENT_CLINIC_OR_DEPARTMENT_OTHER): Payer: Self-pay | Admitting: Emergency Medicine

## 2016-06-08 ENCOUNTER — Emergency Department (HOSPITAL_BASED_OUTPATIENT_CLINIC_OR_DEPARTMENT_OTHER)
Admission: EM | Admit: 2016-06-08 | Discharge: 2016-06-08 | Disposition: A | Discharge status: Home / Self Care | Payer: Self-pay | Attending: Emergency Medicine | Admitting: Emergency Medicine

## 2016-06-08 ENCOUNTER — Emergency Department (HOSPITAL_BASED_OUTPATIENT_CLINIC_OR_DEPARTMENT_OTHER): Payer: Self-pay

## 2016-06-08 DIAGNOSIS — N200 Calculus of kidney: Secondary | ICD-10-CM | POA: Insufficient documentation

## 2016-06-08 DIAGNOSIS — I10 Essential (primary) hypertension: Secondary | ICD-10-CM | POA: Insufficient documentation

## 2016-06-08 DIAGNOSIS — F17210 Nicotine dependence, cigarettes, uncomplicated: Secondary | ICD-10-CM | POA: Insufficient documentation

## 2016-06-08 DIAGNOSIS — Z79899 Other long term (current) drug therapy: Secondary | ICD-10-CM | POA: Insufficient documentation

## 2016-06-08 LAB — URINE MICROSCOPIC-ADD ON

## 2016-06-08 LAB — BASIC METABOLIC PANEL
Anion gap: 12 (ref 5–15)
BUN: 17 mg/dL (ref 6–20)
CHLORIDE: 105 mmol/L (ref 101–111)
CO2: 23 mmol/L (ref 22–32)
CREATININE: 1.32 mg/dL — AB (ref 0.61–1.24)
Calcium: 9.5 mg/dL (ref 8.9–10.3)
GFR calc Af Amer: >60 mL/min (ref >60)
GFR calc non Af Amer: >60 mL/min (ref >60)
GLUCOSE: 112 mg/dL — AB (ref 65–99)
Potassium: 3.9 mmol/L (ref 3.5–5.1)
SODIUM: 140 mmol/L (ref 135–145)

## 2016-06-08 LAB — URINALYSIS, ROUTINE W REFLEX MICROSCOPIC
BILIRUBIN URINE: NEGATIVE
GLUCOSE, UA: NEGATIVE mg/dL
Ketones, ur: NEGATIVE mg/dL
Leukocytes, UA: NEGATIVE
Nitrite: NEGATIVE
Protein, ur: NEGATIVE mg/dL
Specific Gravity, Urine: 1.016 (ref 1.005–1.030)
pH: 7 (ref 5.0–8.0)

## 2016-06-08 LAB — CBC
HEMATOCRIT: 41.1 % (ref 39.0–52.0)
Hemoglobin: 13.9 g/dL (ref 13.0–17.0)
MCH: 30.2 pg (ref 26.0–34.0)
MCHC: 33.8 g/dL (ref 30.0–36.0)
MCV: 89.3 fL (ref 78.0–100.0)
PLATELETS: 206 10*3/uL (ref 150–400)
RBC: 4.6 MIL/uL (ref 4.22–5.81)
RDW: 13.5 % (ref 11.5–15.5)
WBC: 11.7 10*3/uL — AB (ref 4.0–10.5)

## 2016-06-08 MED ORDER — ONDANSETRON 4 MG PO TBDP: ORAL_TABLET | ORAL | Status: DC

## 2016-06-08 MED ORDER — MORPHINE SULFATE (PF) 4 MG/ML IV SOLN
4.0000 mg | Freq: Once | INTRAVENOUS | Status: AC
  Administered 2016-06-08: 4 mg via INTRAVENOUS
  Filled 2016-06-08: qty 1

## 2016-06-08 MED ORDER — ONDANSETRON HCL 4 MG/2ML IJ SOLN
4.0000 mg | Freq: Once | INTRAMUSCULAR | Status: AC
  Administered 2016-06-08: 4 mg via INTRAVENOUS
  Filled 2016-06-08: qty 2

## 2016-06-08 MED ORDER — KETOROLAC TROMETHAMINE 30 MG/ML IJ SOLN
30.0000 mg | Freq: Once | INTRAMUSCULAR | Status: AC
  Administered 2016-06-08: 30 mg via INTRAVENOUS
  Filled 2016-06-08: qty 1

## 2016-06-08 MED ORDER — FENTANYL CITRATE (PF) 100 MCG/2ML IJ SOLN
50.0000 ug | INTRAMUSCULAR | Status: DC | PRN
  Administered 2016-06-08: 50 ug via INTRAVENOUS
  Filled 2016-06-08: qty 2

## 2016-06-08 MED ORDER — TAMSULOSIN HCL 0.4 MG PO CAPS: 0.4000 mg | ORAL_CAPSULE | Freq: Every day | ORAL | Status: DC

## 2016-06-08 MED ORDER — OXYCODONE-ACETAMINOPHEN 5-325 MG PO TABS: 1.0000 | ORAL_TABLET | Freq: Four times a day (QID) | ORAL | Status: DC | PRN

## 2016-06-08 NOTE — Discharge Instructions (Signed)
Follow-up with your urologist. Your creatinine, kidney function, is mildly elevated. This needs to be rechecked by her doctor. Return to emergency room if you have any worsening pain, vomiting, fevers or difficulty urinating.  Kidney Stones Kidney stones (urolithiasis) are deposits that form inside your kidneys. The intense pain is caused by the stone moving through the urinary tract. When the stone moves, the ureter goes into spasm around the stone. The stone is usually passed in the urine.  CAUSES   A disorder that makes certain neck glands produce too much parathyroid hormone (primary hyperparathyroidism).  A buildup of uric acid crystals, similar to gout in your joints.  Narrowing (stricture) of the ureter.  A kidney obstruction present at birth (congenital obstruction).  Previous surgery on the kidney or ureters.  Numerous kidney infections. SYMPTOMS   Feeling sick to your stomach (nauseous).  Throwing up (vomiting).  Blood in the urine (hematuria).  Pain that usually spreads (radiates) to the groin.  Frequency or urgency of urination. DIAGNOSIS   Taking a history and physical exam.  Blood or urine tests.  CT scan.  Occasionally, an examination of the inside of the urinary bladder (cystoscopy) is performed. TREATMENT   Observation.  Increasing your fluid intake.  Extracorporeal shock wave lithotripsy--This is a noninvasive procedure that uses shock waves to break up kidney stones.  Surgery may be needed if you have severe pain or persistent obstruction. There are various surgical procedures. Most of the procedures are performed with the use of small instruments. Only small incisions are needed to accommodate these instruments, so recovery time is minimized. The size, location, and chemical composition are all important variables that will determine the proper choice of action for you. Talk to your health care provider to better understand your situation so that you  will minimize the risk of injury to yourself and your kidney.  HOME CARE INSTRUCTIONS   Drink enough water and fluids to keep your urine clear or pale yellow. This will help you to pass the stone or stone fragments.  Strain all urine through the provided strainer. Keep all particulate matter and stones for your health care provider to see. The stone causing the pain may be as small as a grain of salt. It is very important to use the strainer each and every time you pass your urine. The collection of your stone will allow your health care provider to analyze it and verify that a stone has actually passed. The stone analysis will often identify what you can do to reduce the incidence of recurrences.  Only take over-the-counter or prescription medicines for pain, discomfort, or fever as directed by your health care provider.  Keep all follow-up visits as told by your health care provider. This is important.  Get follow-up X-rays if required. The absence of pain does not always mean that the stone has passed. It may have only stopped moving. If the urine remains completely obstructed, it can cause loss of kidney function or even complete destruction of the kidney. It is your responsibility to make sure X-rays and follow-ups are completed. Ultrasounds of the kidney can show blockages and the status of the kidney. Ultrasounds are not associated with any radiation and can be performed easily in a matter of minutes.  Make changes to your daily diet as told by your health care provider. You may be told to:  Limit the amount of salt that you eat.  Eat 5 or more servings of fruits and vegetables each day.  Limit the amount of meat, poultry, fish, and eggs that you eat.  Collect a 24-hour urine sample as told by your health care provider.You may need to collect another urine sample every 6-12 months. SEEK MEDICAL CARE IF:  You experience pain that is progressive and unresponsive to any pain medicine you  have been prescribed. SEEK IMMEDIATE MEDICAL CARE IF:   Pain cannot be controlled with the prescribed medicine.  You have a fever or shaking chills.  The severity or intensity of pain increases over 18 hours and is not relieved by pain medicine.  You develop a new onset of abdominal pain.  You feel faint or pass out.  You are unable to urinate.   This information is not intended to replace advice given to you by your health care provider. Make sure you discuss any questions you have with your health care provider.   Document Released: 11/05/2005 Document Revised: 07/27/2015 Document Reviewed: 04/08/2013 Elsevier Interactive Patient Education [employer]2016 [employer] Inc.

## 2016-06-08 NOTE — ED Notes (Signed)
Pt c/o right side flank pain with sudden onset, severe in nature with nausea and vomiting. Reports having a kidney stone about 8 months ago.

## 2016-06-08 NOTE — ED Notes (Signed)
MD Belfi at bedside. 

## 2016-06-08 NOTE — ED Notes (Signed)
Pt transported to CT at this time.

## 2016-06-08 NOTE — ED Notes (Addendum)
Pt returned from CT °

## 2016-06-08 NOTE — ED Provider Notes (Signed)
CSN: [id number]     Arrival date & time 06/08/16  1820 History  By signing my name below, I, Freida Busman, attest that this documentation has been prepared under the direction and in the presence of Rolan Bucco, MD . Electronically Signed: Freida Busman, Scribe. 06/08/2016. 7:20 PM.    Chief Complaint  Patient presents with  . Flank Pain   The history is provided by the patient. No language interpreter was used.   HPI Comments:  Jeremy Smith is a 50 y.o. male who presents to the Emergency Department complaining of progressively worsening, 10/10, right flank pain which began this AM with associated nausea. Pt reports h/o kidney stones ~1 year ago which he was able to pass on his own; notes symptoms today are similar. He denies vomiting, pain in his testicle, hematuria, dysuria, and fever. No alleviating factors noted.   Past Medical History  Diagnosis Date  . Hypertension   . High cholesterol    Past Surgical History  Procedure Laterality Date  . Back surgery     No family history on file. Social History  Substance Use Topics  . Smoking status: Current Every Day Smoker -- 0.50 packs/day    Types: Cigarettes  . Smokeless tobacco: None  . Alcohol Use: Yes     Comment: occ    Review of Systems  Constitutional: Negative for fever.  Gastrointestinal: Positive for nausea. Negative for vomiting.  Genitourinary: Positive for flank pain. Negative for dysuria, hematuria and testicular pain.  All other systems reviewed and are negative.   Allergies  Review of patient's allergies indicates no known allergies.  Home Medications   Prior to Admission medications   Medication Sig Start Date End Date Taking? Authorizing Provider  diazepam (VALIUM) 5 MG tablet Take 1 tablet (5 mg total) by mouth 2 (two) times daily. Patient not taking: Reported on 05/24/2015 03/01/14   Ruby Cola, PA-C  diphenhydramine-acetaminophen (TYLENOL PM) 25-500 MG TABS Take 1-2 tablets by mouth at  bedtime as needed (sleep).    Historical Provider, MD  HYDROcodone-acetaminophen (NORCO/VICODIN) 5-325 MG per tablet Take 1 tablet by mouth every 4 (four) hours as needed for moderate pain. Patient not taking: Reported on 05/24/2015 03/01/14   Ruby Cola, PA-C  ibuprofen (ADVIL,MOTRIN) 800 MG tablet Take 1 tablet (800 mg total) by mouth 3 (three) times daily. Patient not taking: Reported on 05/24/2015 03/01/14   Ruby Cola, PA-C  naproxen (NAPROSYN) 500 MG tablet Take 500 mg by mouth daily as needed. Back pain 02/19/14   Renne Crigler, PA-C  ondansetron (ZOFRAN ODT) 4 MG disintegrating tablet 4mg  ODT q4 hours prn nausea/vomit 06/08/16   Rolan Bucco, MD  oxyCODONE-acetaminophen (PERCOCET/ROXICET) 5-325 MG tablet Take 1 tablet by mouth every 6 (six) hours as needed for severe pain. 06/08/16   Rolan Bucco, MD  tamsulosin (FLOMAX) 0.4 MG CAPS capsule Take 1 capsule (0.4 mg total) by mouth daily. 06/08/16   Rolan Bucco, MD   BP 159/89 mmHg  Pulse 77  Temp(Src) 98.1 F (36.7 C) (Oral)  Resp 18  Ht 6' (1.829 m)  Wt 190 lb (86.183 kg)  BMI 25.76 kg/m2  SpO2 100% Physical Exam  Constitutional: He is oriented to person, place, and time. He appears well-developed and well-nourished. He appears distressed.  Appears uncomfortable   HENT:  Head: Normocephalic and atraumatic.  Eyes: EOM are normal.  Neck: Normal range of motion.  Cardiovascular: Normal rate, regular rhythm, normal heart sounds and intact distal pulses.   Pulmonary/Chest: Effort normal and  breath sounds normal. No respiratory distress.  Abdominal: Soft. He exhibits no distension. There is tenderness. There is CVA tenderness.  Right flank and RLQ tenderness  Genitourinary:  No pain inguinal canal or testicles  Musculoskeletal: Normal range of motion.  Neurological: He is alert and oriented to person, place, and time.  Skin: Skin is warm and dry.  Psychiatric: He has a normal mood and affect. Judgment normal.   Nursing note and vitals reviewed.   ED Course  Procedures   DIAGNOSTIC STUDIES:  Oxygen Saturation is 100% on RA, normal by my interpretation.    COORDINATION OF CARE:  7:04 PM Will order pain meds and renal CT.  Discussed treatment plan with pt at bedside and pt agreed to plan.  Labs Review Results for orders placed or performed during the hospital encounter of 06/08/16  Urinalysis, Routine w reflex microscopic- may I&O cath if menses  Result Value Ref Range   Color, Urine YELLOW YELLOW   APPearance CLEAR CLEAR   Specific Gravity, Urine 1.016 1.005 - 1.030   pH 7.0 5.0 - 8.0   Glucose, UA NEGATIVE NEGATIVE mg/dL   Hgb urine dipstick LARGE (A) NEGATIVE   Bilirubin Urine NEGATIVE NEGATIVE   Ketones, ur NEGATIVE NEGATIVE mg/dL   Protein, ur NEGATIVE NEGATIVE mg/dL   Nitrite NEGATIVE NEGATIVE   Leukocytes, UA NEGATIVE NEGATIVE  Basic metabolic panel  Result Value Ref Range   Sodium 140 135 - 145 mmol/L   Potassium 3.9 3.5 - 5.1 mmol/L   Chloride 105 101 - 111 mmol/L   CO2 23 22 - 32 mmol/L   Glucose, Bld 112 (H) 65 - 99 mg/dL   BUN 17 6 - 20 mg/dL   Creatinine, Ser 4.091.32 (H) 0.61 - 1.24 mg/dL   Calcium 9.5 8.9 - 81.110.3 mg/dL   GFR calc non Af Amer >60 >60 mL/min   GFR calc Af Amer >60 >60 mL/min   Anion gap 12 5 - 15  CBC  Result Value Ref Range   WBC 11.7 (H) 4.0 - 10.5 K/uL   RBC 4.60 4.22 - 5.81 MIL/uL   Hemoglobin 13.9 13.0 - 17.0 g/dL   HCT 91.441.1 78.239.0 - 95.652.0 %   MCV 89.3 78.0 - 100.0 fL   MCH 30.2 26.0 - 34.0 pg   MCHC 33.8 30.0 - 36.0 g/dL   RDW 21.313.5 08.611.5 - 57.815.5 %   Platelets 206 150 - 400 K/uL  Urine microscopic-add on  Result Value Ref Range   Squamous Epithelial / LPF 0-5 (A) NONE SEEN   WBC, UA 0-5 0 - 5 WBC/hpf   RBC / HPF TOO NUMEROUS TO COUNT 0 - 5 RBC/hpf   Bacteria, UA RARE (A) NONE SEEN   Crystals CA OXALATE CRYSTALS (A) NEGATIVE   Urine-Other MUCOUS PRESENT    Ct Renal Stone Study  06/08/2016  CLINICAL DATA:  Right-sided flank pain, sudden  onset, severe in nature with nausea and vomiting. History of kidney stone about 8 months ago. EXAM: CT ABDOMEN AND PELVIS WITHOUT CONTRAST TECHNIQUE: Multidetector CT imaging of the abdomen and pelvis was performed following the standard protocol without IV contrast. COMPARISON:  CT abdomen dated 05/24/2015. FINDINGS: Lower chest:  No acute findings. Hepatobiliary: The liver and gallbladder appear normal. Pancreas: No mass or inflammatory process identified on this un-enhanced exam. Spleen: Within normal limits in size. Adrenals/Urinary Tract: Adrenal glands appear normal. At least 4 small stones within the right renal pelvis, ranging from 2-4 mm. Moderate right-sided hydronephrosis and hydroureter caused  by 2 abutting stones within the distal right ureter, each measuring approximately 3 mm in size. At least 7 small stones within the left renal pelvis, measuring 1 to 4 mm in size. No left-sided hydronephrosis. Stomach/Bowel: Bowel is normal in caliber. New no bowel wall thickening or evidence of bowel wall inflammation seen. Appendix is normal. Stomach appears normal. Vascular/Lymphatic: Mild atherosclerotic changes of the normal caliber abdominal aorta. Scattered small and moderately enlarged lymph nodes within the right lower quadrant mesentery, likely reactive or incidental. Reproductive: No mass or other significant abnormality. Other: No free fluid or abscess collection. No free intraperitoneal air. Musculoskeletal: Mild degenerative change within the lumbar spine. No acute or suspicious osseous finding. Superficial soft tissues are unremarkable. IMPRESSION: 1. Two abutting 3 mm stones within the distal right ureter, just proximal to the right UVJ, causing moderate hydronephrosis. 2. Bilateral nephrolithiasis. 3. Aortic atherosclerosis. Additional chronic/incidental findings detailed above. Electronically Signed   By: Bary Richard M.D.   On: 06/08/2016 20:30      I have personally reviewed and evaluated  these images and lab results as part of my medical decision-making.   MDM   Final diagnoses:  Kidney stones    Patient's pain is controlled in the ED. He has no evidence of infection. His creatinine is mildly elevated. I encouraged him to follow-up with a urologist. He was given prescriptions for Percocet, Flomax, and Zofran. He was advised that he needs to have his creatinine rechecked by his doctor. He was advised return here if he has any worsening pain vomiting or fevers.  I personally performed the services described in this documentation, which was scribed in my presence.  The recorded information has been reviewed and considered.    Rolan Bucco, MD 06/08/16 2153

## 2016-06-13 ENCOUNTER — Telehealth: Payer: Self-pay | Admitting: Surgery

## 2016-06-13 NOTE — Telephone Encounter (Signed)
ED CM r;eceived call from patient stating he is unable to afford the prescriptions that he received from his ED visit 7/21. CM reviewed patient record patient is uninsured. Discussed MATCH program and the guidelines including the $3 copay, patient is agreeabe with guidelines. Patient was enrolled in [facility] program. Patient asked if I could call letter in to [facility] on [address]. He says he  does not have transportation. CM faxed the [facility] letter to [facility] [phone], Fax confirmation received. Patient was instructed to check with pharmacy on when medications will be ready.

## 2016-07-18 ENCOUNTER — Emergency Department (HOSPITAL_COMMUNITY)
Admission: EM | Admit: 2016-07-18 | Discharge: 2016-07-19 | Disposition: A | Discharge status: Home / Self Care | Payer: Self-pay | Attending: Emergency Medicine | Admitting: Emergency Medicine

## 2016-07-18 ENCOUNTER — Encounter (HOSPITAL_COMMUNITY): Payer: Self-pay | Admitting: *Deleted

## 2016-07-18 DIAGNOSIS — T162XXA Foreign body in left ear, initial encounter: Secondary | ICD-10-CM | POA: Insufficient documentation

## 2016-07-18 DIAGNOSIS — Y999 Unspecified external cause status: Secondary | ICD-10-CM | POA: Insufficient documentation

## 2016-07-18 DIAGNOSIS — X58XXXA Exposure to other specified factors, initial encounter: Secondary | ICD-10-CM | POA: Insufficient documentation

## 2016-07-18 DIAGNOSIS — Y939 Activity, unspecified: Secondary | ICD-10-CM | POA: Insufficient documentation

## 2016-07-18 DIAGNOSIS — F17210 Nicotine dependence, cigarettes, uncomplicated: Secondary | ICD-10-CM | POA: Insufficient documentation

## 2016-07-18 DIAGNOSIS — Y9289 Other specified places as the place of occurrence of the external cause: Secondary | ICD-10-CM | POA: Insufficient documentation

## 2016-07-18 DIAGNOSIS — I10 Essential (primary) hypertension: Secondary | ICD-10-CM | POA: Insufficient documentation

## 2016-07-18 NOTE — ED Provider Notes (Signed)
[facility]-EMERGENCY DEPT Provider Note   CSN: [id number] Arrival date & time: 07/18/16  2225  By signing my name below, I, Linna DarnerRussell Turner, attest that this documentation has been prepared under the direction and in the presence of Kerrie BuffaloHope Raffael Bugarin, NP. Electronically Signed: Linna Darnerussell Turner, Scribe. 07/18/2016. 11:42 PM.  History   Chief Complaint Chief Complaint  Patient presents with  . Foreign Body in Ear    The history is provided by the patient. No language interpreter was used.  Foreign Body in Ear  This is a new problem. The current episode started 3 to 5 hours ago. The problem occurs rarely. The problem has not changed since onset.Pertinent negatives include no chest pain, no abdominal pain, no headaches and no shortness of breath. Nothing aggravates the symptoms. He has tried nothing for the symptoms.     HPI Comments: Jeremy Smith is a 50 y.o. male who presents to the Emergency Department complaining of foreign body in his left ear beginning about 3 hours ago. Pt reports he was sitting on his porch and an insect flew into his left ear. He reports he washed his left ear out with water and peroxide but believes the insect is still in his ear. He endorses associated hearing loss from his left ear. Pt denies left ear pain, fever, chills, nausea, vomiting, or any other associated symptoms.  Past Medical History:  Diagnosis Date  . High cholesterol   . Hypertension     There are no active problems to display for this patient.   Past Surgical History:  Procedure Laterality Date  . BACK SURGERY        Home Medications    Prior to Admission medications   Medication Sig Start Date End Date Taking? Authorizing Provider  diazepam (VALIUM) 5 MG tablet Take 1 tablet (5 mg total) by mouth 2 (two) times daily. Patient not taking: Reported on 05/24/2015 03/01/14   Ruby Colaatherine Schinlever, PA-C  diphenhydramine-acetaminophen (TYLENOL PM) 25-500 MG TABS Take 1-2 tablets by mouth at bedtime as  needed (sleep).    Historical Provider, MD  HYDROcodone-acetaminophen (NORCO/VICODIN) 5-325 MG per tablet Take 1 tablet by mouth every 4 (four) hours as needed for moderate pain. Patient not taking: Reported on 05/24/2015 03/01/14   Ruby Colaatherine Schinlever, PA-C  ibuprofen (ADVIL,MOTRIN) 800 MG tablet Take 1 tablet (800 mg total) by mouth 3 (three) times daily. Patient not taking: Reported on 05/24/2015 03/01/14   Ruby Colaatherine Schinlever, PA-C  naproxen (NAPROSYN) 500 MG tablet Take 500 mg by mouth daily as needed. Back pain 02/19/14   Renne CriglerJoshua Geiple, PA-C  ondansetron (ZOFRAN ODT) 4 MG disintegrating tablet 4mg  ODT q4 hours prn nausea/vomit 06/08/16   Rolan BuccoMelanie Belfi, MD  oxyCODONE-acetaminophen (PERCOCET/ROXICET) 5-325 MG tablet Take 1 tablet by mouth every 6 (six) hours as needed for severe pain. 06/08/16   Rolan BuccoMelanie Belfi, MD  tamsulosin (FLOMAX) 0.4 MG CAPS capsule Take 1 capsule (0.4 mg total) by mouth daily. 06/08/16   Rolan BuccoMelanie Belfi, MD    Family History No family history on file.  Social History Social History  Substance Use Topics  . Smoking status: Current Every Day Smoker    Packs/day: 0.50    Types: Cigarettes  . Smokeless tobacco: Never Used  . Alcohol use Yes     Comment: occ     Allergies   Review of patient's allergies indicates no known allergies.   Review of Systems Review of Systems  Constitutional: Negative for chills and fever.  HENT: Positive for hearing loss (left ear).  Negative for ear pain.        Positive for foreign body in left ear.  Respiratory: Negative for shortness of breath.   Cardiovascular: Negative for chest pain.  Gastrointestinal: Negative for abdominal pain, nausea and vomiting.  Neurological: Negative for headaches.    Physical Exam Updated Vital Signs BP 152/98 (BP Location: Left Arm)   Pulse 88   Temp 98.5 F (36.9 C) (Oral)   Resp 19   Ht 6' (1.829 m)   Wt 83.9 kg   SpO2 98%   BMI 25.09 kg/m   Physical Exam  Constitutional: He is oriented to  person, place, and time. He appears well-developed and well-nourished. No distress.  HENT:  Head: Normocephalic and atraumatic.  Right Ear: Tympanic membrane normal.  Left Ear: A foreign body is present.  Eyes: Conjunctivae and EOM are normal.  Neck: Neck supple. No tracheal deviation present.  Cardiovascular: Normal rate.   Pulmonary/Chest: Effort normal. No respiratory distress.  Musculoskeletal: Normal range of motion.  Neurological: He is alert and oriented to person, place, and time.  Skin: Skin is warm and dry.  Psychiatric: He has a normal mood and affect. His behavior is normal.  Nursing note and vitals reviewed.   ED Treatments / Results: Ear irrigation by me: Using warm water and syringe a small insect with wings removed without difficulty.    Patient re examined after foreign body removed, left ear canal slightly irritated, TM normal.   (all labs ordered are listed, but only abnormal results are displayed) Labs Reviewed - No data to display   Radiology No results found.  Procedures Procedures (including critical care time)  DIAGNOSTIC STUDIES: Oxygen Saturation is 98% on RA, normal by my interpretation.    COORDINATION OF CARE: 11:42 PM Discussed treatment plan with pt at bedside and pt agreed to plan.  Medications Ordered in ED Medications - No data to display   Initial Impression / Assessment and Plan / ED Course  I have reviewed the triage vital signs and the nursing notes.   Clinical Course  Discussed with the patient clinical findings and plan of care and all questioned fully answered. He will return if any problems arise.   I personally performed the services described in this documentation, which was scribed in my presence. The recorded information has been reviewed and is accurate.  Final Clinical Impressions(s) / ED Diagnoses  50 y.o. male with foreign body to left ear stable for d/c without other problems.  Final diagnoses:  Foreign body in  ear, left, initial encounter    New Prescriptions New Prescriptions   No medications on file     [facility], NP 07/19/16 0012    Layla Maw Ward, DO 07/19/16 [phone]

## 2016-07-18 NOTE — ED Triage Notes (Signed)
Pt to ED by [facility] after a bug flew into the L ear. Pt placed peroxide in ear. Denies pain, initially able to hear a bug flapping its wings.

## 2016-07-19 NOTE — ED Notes (Signed)
E-signature not working at this time.  

## 2017-07-21 IMAGING — CT CT RENAL STONE PROTOCOL
2 of 4 series · 16 of 46 positions shown, 18 images · non-contrast
Comparison: CT abdomen dated 05/24/2015.

CLINICAL DATA: Right-sided flank pain, sudden onset, severe in
nature with nausea and vomiting. History of kidney stone about 8
months ago.

EXAM:
CT ABDOMEN AND PELVIS WITHOUT CONTRAST
TECHNIQUE: Multidetector CT imaging of the abdomen and pelvis was performed
following the standard protocol without IV contrast.

[Series 2: axial st · axial · 0.86mm/px · z∈[-470,-50]mm · 13 of 94 slices shown, 15 images]
[im 5/94  soft-tissue]
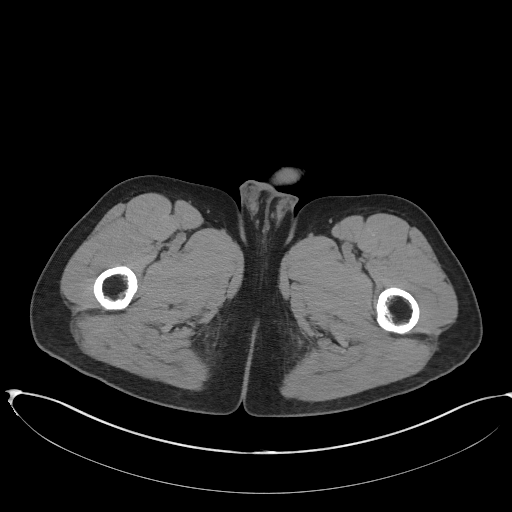
[im 5/94  bone]
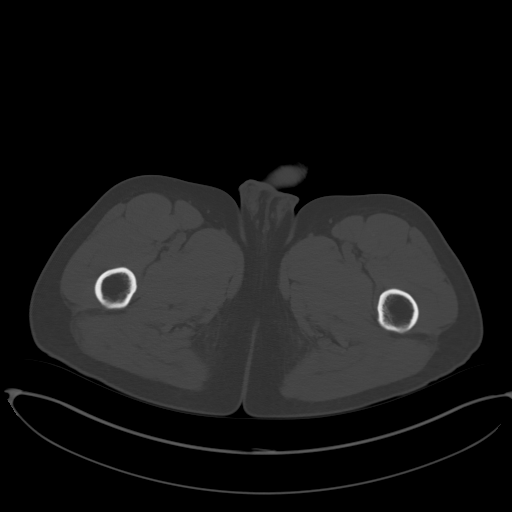
[im 13/94  soft-tissue]
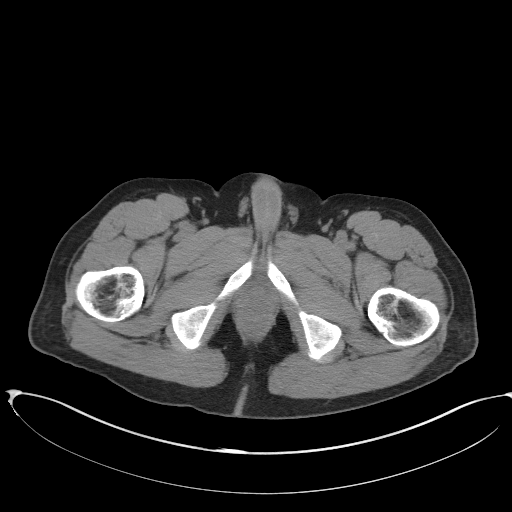
[im 21/94  soft-tissue]
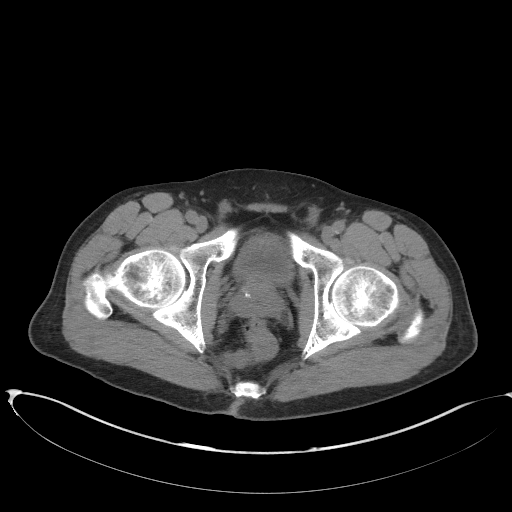
[im 25/94  soft-tissue]
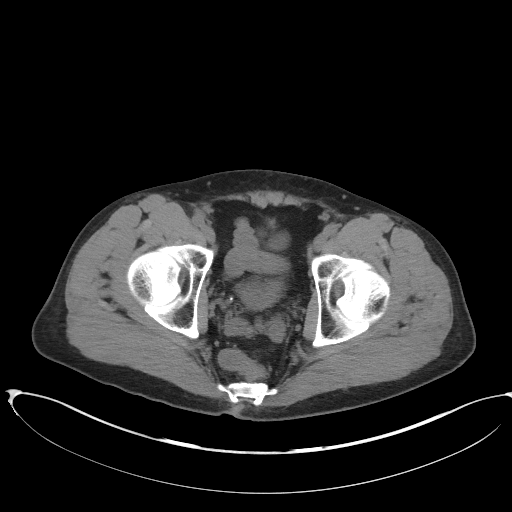
[im 33/94  soft-tissue]
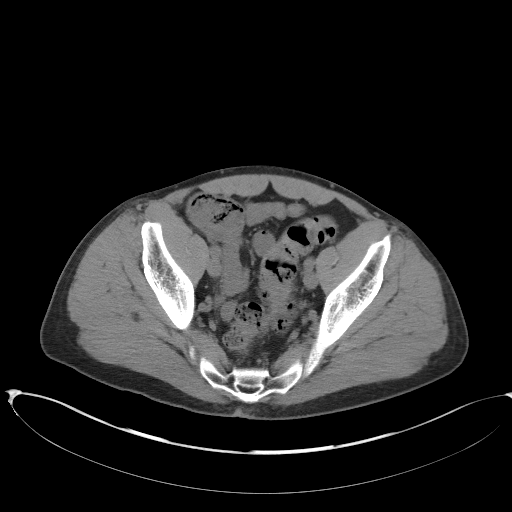
[im 41/94  soft-tissue]
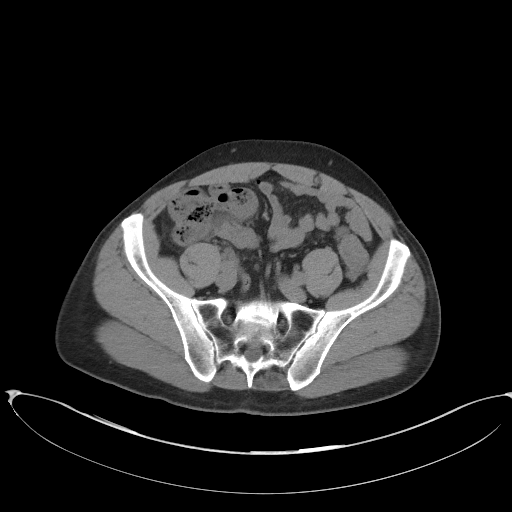
[im 49/94  soft-tissue]
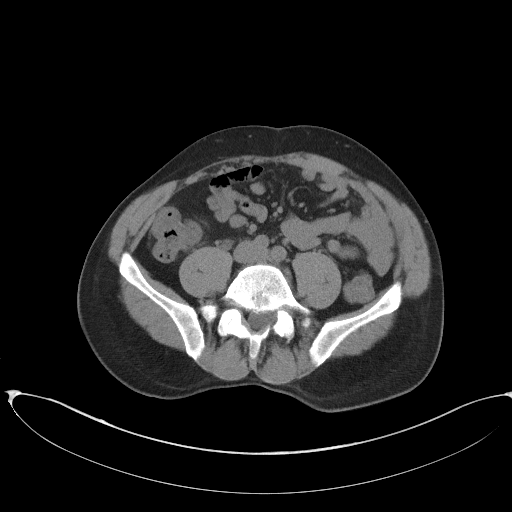
[im 53/94  soft-tissue]
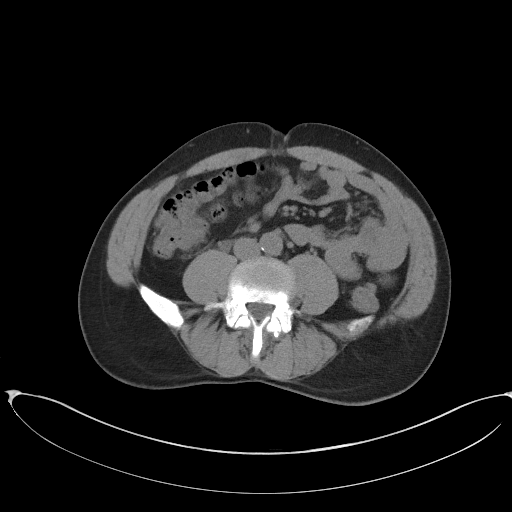
[im 61/94  soft-tissue]
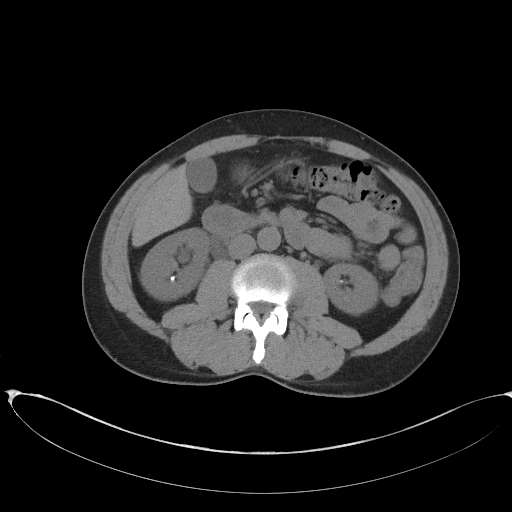
[im 61/94  bone]
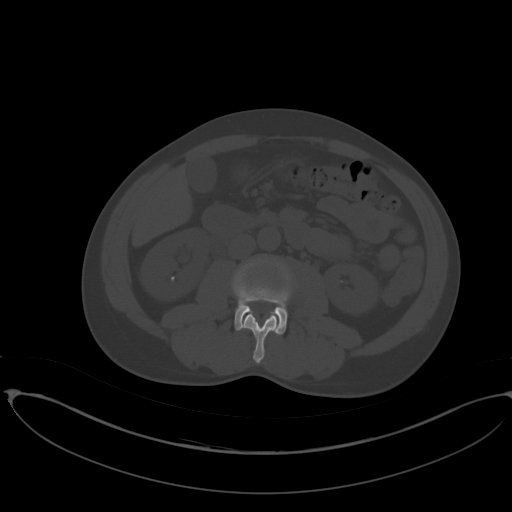
[im 69/94  soft-tissue]
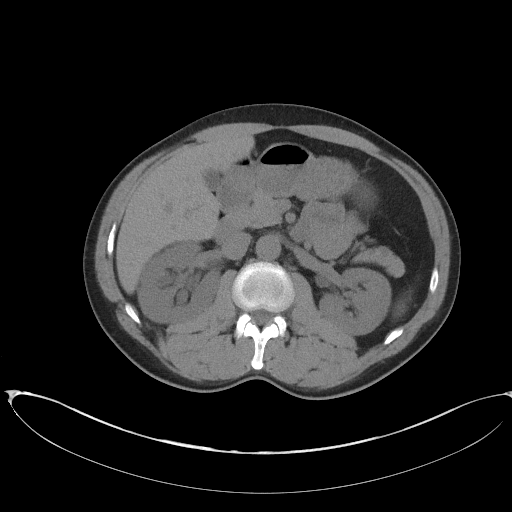
[im 73/94  soft-tissue]
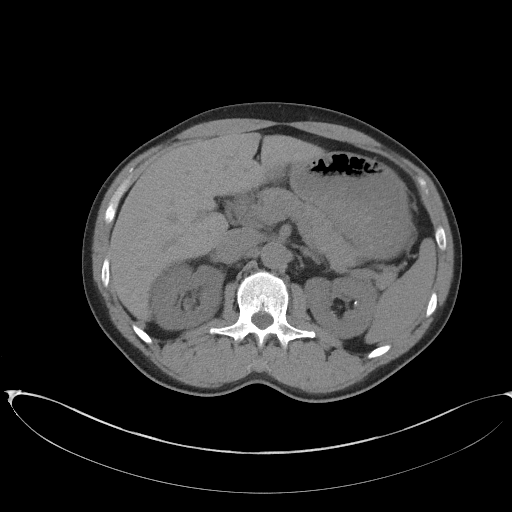
[im 81/94  soft-tissue]
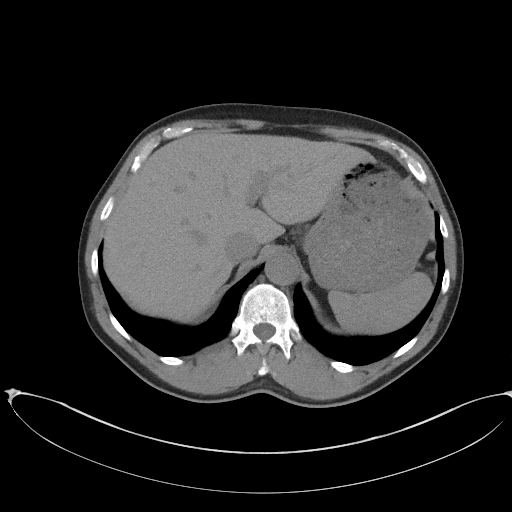
[im 89/94  soft-tissue]
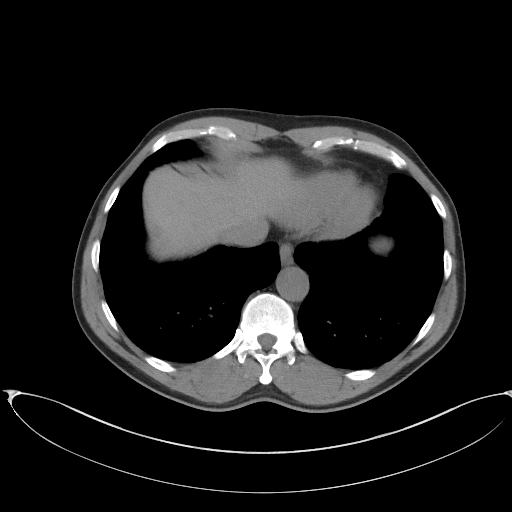

[Series 5: coronal st · coronal · 0.78mm/px · 3 of 88 slices shown]
[im 30/88  soft-tissue]
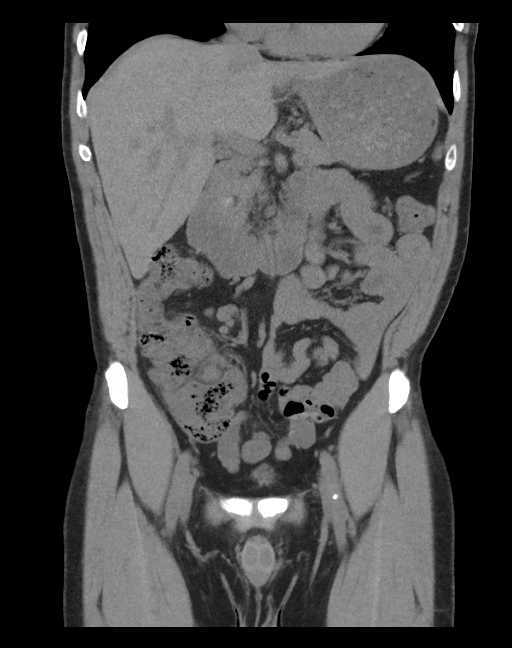
[im 39/88  soft-tissue]
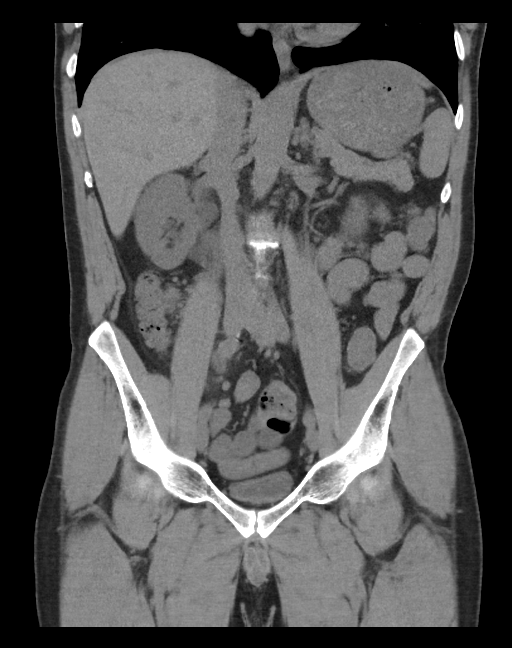
[im 49/88  soft-tissue]
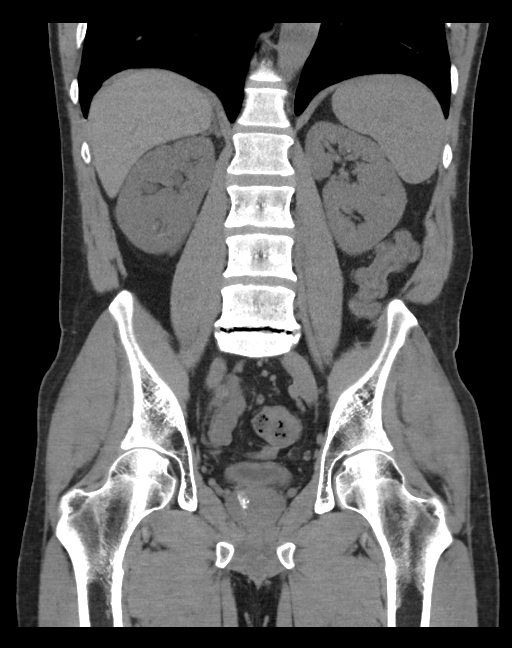

[16 of 46 positions shown; findings below may reference images not displayed]

FINDINGS: Lower chest:  No acute findings.

Hepatobiliary: The liver and gallbladder appear normal.

Pancreas: No mass or inflammatory process identified on this
un-enhanced exam.

Spleen: Within normal limits in size.

Adrenals/Urinary Tract: Adrenal glands appear normal.

At least 4 small stones within the right renal pelvis, ranging from
2-4 mm. Moderate right-sided hydronephrosis and hydroureter caused
by 2 abutting stones within the distal right ureter, each measuring
approximately 3 mm in size.

At least 7 small stones within the left renal pelvis, measuring 1 to
4 mm in size. No left-sided hydronephrosis.

Stomach/Bowel: Bowel is normal in caliber. New no bowel wall
thickening or evidence of bowel wall inflammation seen. Appendix is
normal. Stomach appears normal.

Vascular/Lymphatic: Mild atherosclerotic changes of the normal
caliber abdominal aorta. Scattered small and moderately enlarged
lymph nodes within the right lower quadrant mesentery, likely
reactive or incidental.

Reproductive: No mass or other significant abnormality.

Other: No free fluid or abscess collection. No free intraperitoneal
air.

Musculoskeletal: Mild degenerative change within the lumbar spine.
No acute or suspicious osseous finding. Superficial soft tissues are
unremarkable.
IMPRESSION: 1. Two abutting 3 mm stones within the distal right ureter, just
proximal to the right UVJ, causing moderate hydronephrosis.
2. Bilateral nephrolithiasis.
3. Aortic atherosclerosis. Additional chronic/incidental findings
detailed above.

## 2018-03-31 ENCOUNTER — Ambulatory Visit: Payer: Self-pay

## 2018-06-30 ENCOUNTER — Encounter (HOSPITAL_COMMUNITY): Payer: Self-pay | Admitting: Emergency Medicine

## 2018-06-30 ENCOUNTER — Other Ambulatory Visit: Payer: Self-pay

## 2018-06-30 ENCOUNTER — Emergency Department (HOSPITAL_COMMUNITY): Payer: Self-pay

## 2018-06-30 ENCOUNTER — Emergency Department (HOSPITAL_COMMUNITY)
Admission: EM | Admit: 2018-06-30 | Discharge: 2018-06-30 | Disposition: A | Discharge status: Home / Self Care | Payer: Self-pay | Attending: Emergency Medicine | Admitting: Emergency Medicine

## 2018-06-30 DIAGNOSIS — D72829 Elevated white blood cell count, unspecified: Secondary | ICD-10-CM | POA: Insufficient documentation

## 2018-06-30 DIAGNOSIS — R109 Unspecified abdominal pain: Secondary | ICD-10-CM

## 2018-06-30 DIAGNOSIS — R110 Nausea: Secondary | ICD-10-CM | POA: Insufficient documentation

## 2018-06-30 DIAGNOSIS — F17210 Nicotine dependence, cigarettes, uncomplicated: Secondary | ICD-10-CM | POA: Insufficient documentation

## 2018-06-30 DIAGNOSIS — I10 Essential (primary) hypertension: Secondary | ICD-10-CM | POA: Insufficient documentation

## 2018-06-30 DIAGNOSIS — D729 Disorder of white blood cells, unspecified: Secondary | ICD-10-CM

## 2018-06-30 DIAGNOSIS — N201 Calculus of ureter: Secondary | ICD-10-CM | POA: Insufficient documentation

## 2018-06-30 DIAGNOSIS — N200 Calculus of kidney: Secondary | ICD-10-CM | POA: Insufficient documentation

## 2018-06-30 LAB — COMPREHENSIVE METABOLIC PANEL
ALBUMIN: 4.4 g/dL (ref 3.5–5.0)
ALK PHOS: 75 U/L (ref 38–126)
ALT: 23 U/L (ref 0–44)
AST: 20 U/L (ref 15–41)
Anion gap: 10 (ref 5–15)
BILIRUBIN TOTAL: 0.3 mg/dL (ref 0.3–1.2)
BUN: 16 mg/dL (ref 6–20)
CALCIUM: 9.3 mg/dL (ref 8.9–10.3)
CO2: 22 mmol/L (ref 22–32)
CREATININE: 1.06 mg/dL (ref 0.61–1.24)
Chloride: 106 mmol/L (ref 98–111)
GFR calc Af Amer: >60 mL/min (ref >60)
GLUCOSE: 118 mg/dL — AB (ref 70–99)
Potassium: 3.5 mmol/L (ref 3.5–5.1)
Sodium: 138 mmol/L (ref 135–145)
TOTAL PROTEIN: 7.6 g/dL (ref 6.5–8.1)

## 2018-06-30 LAB — CBC WITH DIFFERENTIAL/PLATELET
BASOS ABS: 0 10*3/uL (ref 0.0–0.1)
Basophils Relative: 0 %
Eosinophils Absolute: 0 10*3/uL (ref 0.0–0.7)
Eosinophils Relative: 0 %
HEMATOCRIT: 43 % (ref 39.0–52.0)
Hemoglobin: 14.5 g/dL (ref 13.0–17.0)
LYMPHS ABS: 0.8 10*3/uL (ref 0.7–4.0)
LYMPHS PCT: 6 %
MCH: 30 pg (ref 26.0–34.0)
MCHC: 33.7 g/dL (ref 30.0–36.0)
MCV: 88.8 fL (ref 78.0–100.0)
Monocytes Absolute: 0.6 10*3/uL (ref 0.1–1.0)
Monocytes Relative: 5 %
NEUTROS ABS: 11.3 10*3/uL — AB (ref 1.7–7.7)
Neutrophils Relative %: 89 %
Platelets: 226 10*3/uL (ref 150–400)
RBC: 4.84 MIL/uL (ref 4.22–5.81)
RDW: 13.6 % (ref 11.5–15.5)
WBC: 12.6 10*3/uL — AB (ref 4.0–10.5)

## 2018-06-30 LAB — URINALYSIS, ROUTINE W REFLEX MICROSCOPIC
Bacteria, UA: NONE SEEN
Bilirubin Urine: NEGATIVE
GLUCOSE, UA: NEGATIVE mg/dL
Ketones, ur: 5 mg/dL — AB
Leukocytes, UA: NEGATIVE
Nitrite: NEGATIVE
PROTEIN: NEGATIVE mg/dL
SPECIFIC GRAVITY, URINE: 1.011 (ref 1.005–1.030)
pH: 8 (ref 5.0–8.0)

## 2018-06-30 LAB — LIPASE, BLOOD: Lipase: 26 U/L (ref 11–51)

## 2018-06-30 MED ORDER — MORPHINE SULFATE (PF) 4 MG/ML IV SOLN
4.0000 mg | Freq: Once | INTRAVENOUS | Status: AC
  Administered 2018-06-30: 4 mg via INTRAVENOUS
  Filled 2018-06-30: qty 1

## 2018-06-30 MED ORDER — ONDANSETRON HCL 4 MG/2ML IJ SOLN
4.0000 mg | Freq: Once | INTRAMUSCULAR | Status: AC
  Administered 2018-06-30: 4 mg via INTRAVENOUS
  Filled 2018-06-30: qty 2

## 2018-06-30 MED ORDER — KETOROLAC TROMETHAMINE 30 MG/ML IJ SOLN
15.0000 mg | Freq: Once | INTRAMUSCULAR | Status: AC
  Administered 2018-06-30: 15 mg via INTRAVENOUS
  Filled 2018-06-30: qty 1

## 2018-06-30 MED ORDER — HYDROCODONE-ACETAMINOPHEN 5-325 MG PO TABS: 1.0000 | ORAL_TABLET | Freq: Four times a day (QID) | ORAL | 0 refills | Status: DC | PRN

## 2018-06-30 MED ORDER — TAMSULOSIN HCL 0.4 MG PO CAPS: 0.4000 mg | ORAL_CAPSULE | Freq: Every day | ORAL | 0 refills | Status: DC

## 2018-06-30 MED ORDER — NAPROXEN 500 MG PO TABS: 500.0000 mg | ORAL_TABLET | Freq: Two times a day (BID) | ORAL | 0 refills | Status: DC | PRN

## 2018-06-30 MED ORDER — ONDANSETRON 4 MG PO TBDP: 4.0000 mg | ORAL_TABLET | Freq: Three times a day (TID) | ORAL | 0 refills | Status: DC | PRN

## 2018-06-30 NOTE — ED Triage Notes (Signed)
Per [facility] pt from home for left flank pain that started today. Denies hematuria with increased urination and dysuria. Hx kidney stones. Been out of HTN medications x 3 weeks due to insurance issues.

## 2018-06-30 NOTE — Discharge Instructions (Signed)
Take naprosyn as directed as needed for pain using norco for breakthrough pain. Do not drive or operate machinery with pain medication use. May need over-the-counter stool softener with this pain medication use. Use Zofran as needed for nausea. Use Flomax as directed, as this medication will help you pass the stone. Strain all urine to try to catch the stone when it passes. Follow-up with the urologist in the next 1 to 2 weeks for recheck of ongoing pain, however for intractable or uncontrollable symptoms at home then return to the [facility].  Your blood pressure was elevated today. Eat a low salt/low sodium diet, and take all of your home blood pressure medications as directed. Keep a log of your blood pressure readings from every morning and evening (making sure to give yourself at least 15 minutes of rest prior to checking it) and take it to your doctor's office at your next appointment for ongoing management of your blood pressure. Stay well hydrated and get plenty of rest. Avoid caffeine and other over the counter products that would make your blood pressure go up (such as decongestants, excedrin, etc). Follow up with [facility] in 1 week for recheck of symptoms and ongoing management of your blood pressure, and to establish medical care. Return to the ER for emergent changes or worsening symptoms.

## 2018-06-30 NOTE — ED Provider Notes (Signed)
[facility]-EMERGENCY DEPT Provider Note   CSN: [id number] Arrival date & time: 06/30/18  1446     History   Chief Complaint Chief Complaint  Patient presents with  . Flank Pain    HPI Jeremy Smith is a 52 y.o. male with a PMHx of HTN, HLD, and kidney stones, who presents to the ED with complaints of fairly sudden onset left-sided flank pain for the last 3.5 hours.  He states this feels like prior kidney stones.  He reports that yesterday he had some mild abdominal pain that went away, but then today around 1 PM he started having left flank pain that he describes as 7/10 currently but 10/10 at worst, waxing/waning constant sharp pain radiating into the left lateral abdomen, with no known aggravating factors, and with no treatments tried prior to arrival and no known alleviating factors.  He reports associated nausea.  He has never needed surgery for his prior kidney stones, they have always passed on their own.  He has not been seen by urologist due to financial issues.  Of note, he also mentions that he has been out of his blood pressure medications for 3 weeks also due to financial concern.  He is not sure what he takes for his blood pressure.  He denies fevers, chills, HA, vision changes, CP, SOB, V/D/C, obstipation, melena, hematochezia, hematuria, dysuria, urinary frequency, testicular pain/swelling, penile discharge, myalgias, arthralgias, numbness, tingling, focal weakness, or any other complaints at this time.   The history is provided by the patient and medical records. No language interpreter was used.  Flank Pain  Associated symptoms include abdominal pain. Pertinent negatives include no chest pain, no headaches and no shortness of breath.    Past Medical History:  Diagnosis Date  . High cholesterol   . Hypertension     There are no active problems to display for this patient.   Past Surgical History:  Procedure Laterality Date  . BACK SURGERY           Home Medications    Prior to Admission medications   Medication Sig Start Date End Date Taking? Authorizing Provider  diazepam (VALIUM) 5 MG tablet Take 1 tablet (5 mg total) by mouth 2 (two) times daily. Patient not taking: Reported on 05/24/2015 03/01/14   Schinlever, Santina Evans, PA-C  diphenhydramine-acetaminophen (TYLENOL PM) 25-500 MG TABS Take 1-2 tablets by mouth at bedtime as needed (sleep).    [provider]  HYDROcodone-acetaminophen (NORCO/VICODIN) 5-325 MG per tablet Take 1 tablet by mouth every 4 (four) hours as needed for moderate pain. Patient not taking: Reported on 05/24/2015 03/01/14   Schinlever, Santina Evans, PA-C  ibuprofen (ADVIL,MOTRIN) 800 MG tablet Take 1 tablet (800 mg total) by mouth 3 (three) times daily. Patient not taking: Reported on 05/24/2015 03/01/14   Schinlever, Santina Evans, PA-C  naproxen (NAPROSYN) 500 MG tablet Take 500 mg by mouth daily as needed. Back pain 02/19/14   Renne Crigler, PA-C  ondansetron (ZOFRAN ODT) 4 MG disintegrating tablet 4mg  ODT q4 hours prn nausea/vomit 06/08/16   Rolan Bucco, MD  oxyCODONE-acetaminophen (PERCOCET/ROXICET) 5-325 MG tablet Take 1 tablet by mouth every 6 (six) hours as needed for severe pain. 06/08/16   Rolan Bucco, MD  tamsulosin (FLOMAX) 0.4 MG CAPS capsule Take 1 capsule (0.4 mg total) by mouth daily. 06/08/16   Rolan Bucco, MD    Family History No family history on file.  Social History Social History   Tobacco Use  . Smoking status: Current Every Day  Smoker    Packs/day: 0.50    Types: Cigarettes  . Smokeless tobacco: Never Used  Substance Use Topics  . Alcohol use: Yes    Comment: occ  . Drug use: Yes    Types: Marijuana     Allergies   Patient has no known allergies.   Review of Systems Review of Systems  Constitutional: Negative for chills and fever.  Eyes: Negative for visual disturbance.  Respiratory: Negative for shortness of breath.   Cardiovascular: Negative for chest  pain.  Gastrointestinal: Positive for abdominal pain and nausea. Negative for blood in stool, constipation, diarrhea and vomiting.  Genitourinary: Positive for flank pain. Negative for discharge, dysuria, frequency, hematuria, scrotal swelling and testicular pain.  Musculoskeletal: Negative for arthralgias and myalgias.  Skin: Negative for color change.  Allergic/Immunologic: Negative for immunocompromised state.  Neurological: Negative for weakness, numbness and headaches.  Psychiatric/Behavioral: Negative for confusion.   All other systems reviewed and are negative for acute change except as noted in the HPI.    Physical Exam Updated Vital Signs BP (!) 170/120 (BP Location: Left Arm)   Pulse 79   Temp 97.9 F (36.6 C) (Oral)   Resp (!) 24   Ht 6\' 1"  (1.854 m)   Wt 95.3 kg   SpO2 100%   BMI 27.71 kg/m   Physical Exam  Constitutional: He is oriented to person, place, and time. Vital signs are normal. He appears well-developed and well-nourished.  Non-toxic appearance. No distress.  Afebrile, nontoxic, NAD, HTN noted similar to prior visits  HENT:  Head: Normocephalic and atraumatic.  Mouth/Throat: Oropharynx is clear and moist and mucous membranes are normal.  Eyes: Conjunctivae and EOM are normal. Right eye exhibits no discharge. Left eye exhibits no discharge.  Neck: Normal range of motion. Neck supple.  Cardiovascular: Normal rate, regular rhythm, normal heart sounds and intact distal pulses. Exam reveals no gallop and no friction rub.  No murmur heard. Pulmonary/Chest: Effort normal and breath sounds normal. No respiratory distress. He has no decreased breath sounds. He has no wheezes. He has no rhonchi. He has no rales.  Abdominal: Soft. Normal appearance and bowel sounds are normal. He exhibits no distension. There is tenderness in the left upper quadrant and left lower quadrant. There is no rigidity, no rebound, no guarding, no CVA tenderness, no tenderness at McBurney's  point and negative Murphy's sign.    Soft, nondistended, +BS throughout, with moderate L lateral abd TTP tracking towards the L flank area, no r/g/r, neg murphy's, neg mcburney's, no CVA TTP   Musculoskeletal: Normal range of motion.  Neurological: He is alert and oriented to person, place, and time. He has normal strength. No sensory deficit.  Skin: Skin is warm, dry and intact. No rash noted.  Psychiatric: He has a normal mood and affect.  Nursing note and vitals reviewed.    ED Treatments / Results  Labs (all labs ordered are listed, but only abnormal results are displayed) Labs Reviewed  URINALYSIS, ROUTINE W REFLEX MICROSCOPIC - Abnormal; Notable for the following components:      Result Value   Hgb urine dipstick LARGE (*)    Ketones, ur 5 (*)    RBC / HPF >50 (*)    All other components within normal limits  CBC WITH DIFFERENTIAL/PLATELET - Abnormal; Notable for the following components:   WBC 12.6 (*)    Neutro Abs 11.3 (*)    All other components within normal limits  COMPREHENSIVE METABOLIC PANEL - Abnormal; Notable  for the following components:   Glucose, Bld 118 (*)    All other components within normal limits  LIPASE, BLOOD    EKG None  Radiology Ct Renal Stone Study  Result Date: 06/30/2018 CLINICAL DATA:  Left flank pain. EXAM: CT ABDOMEN AND PELVIS WITHOUT CONTRAST TECHNIQUE: Multidetector CT imaging of the abdomen and pelvis was performed following the standard protocol without IV contrast. COMPARISON:  CT scan dated 06/08/2016 FINDINGS: Lower chest: No acute abnormality. Hepatobiliary: No focal liver abnormality is seen. No gallstones, gallbladder wall thickening, or biliary dilatation. Pancreas: Unremarkable. No pancreatic ductal dilatation or surrounding inflammatory changes. Spleen: Normal in size without focal abnormality. Adrenals/Urinary Tract: New slight left hydronephrosis. 6 mm stone in the proximal left ureter. Multiple tiny left renal calculi. One  tiny stone in the upper pole of the otherwise normal appearing right kidney. Adrenal glands are normal. No significant abnormality of the bladder. Minimal fat in the bladder wall at the anterior aspect of the bladder. This is not significant. Stomach/Bowel: Stomach is within normal limits. Appendix appears normal. No evidence of bowel wall thickening, distention, or inflammatory changes. Vascular/Lymphatic: Minimal aortic atherosclerosis.  No adenopathy. Reproductive: Benign calcifications in the normal-sized prostate gland. Other: No free air or ascites.  No abdominal wall hernia. Musculoskeletal: Degenerative disc disease at L5-S1. No acute abnormalities. IMPRESSION: 1. 6 mm stone obstructing the proximal left ureter creating a mild left hydronephrosis. 2. Bilateral nephrolithiasis. Electronically Signed   By: Francene BoyersJames  Maxwell M.D.   On: 06/30/2018 17:18    Procedures Procedures (including critical care time)  Medications Ordered in ED Medications  morphine 4 MG/ML injection 4 mg (4 mg Intravenous Given 06/30/18 1636)  ondansetron (ZOFRAN) injection 4 mg (4 mg Intravenous Given 06/30/18 1636)  ketorolac (TORADOL) 30 MG/ML injection 15 mg (15 mg Intravenous Given 06/30/18 1850)     Initial Impression / Assessment and Plan / ED Course  I have reviewed the triage vital signs and the nursing notes.  Pertinent labs & imaging results that were available during my care of the patient were reviewed by me and considered in my medical decision making (see chart for details).     52 y.o. male here with L flank pain x3.5hrs, feels like prior kidney stones. On exam, moderate L lateral abd TTP tracking towards the L flank area, nonperitoneal. HTN noted similar to prior visits, pt off meds x3wks, asymptomatic from this at this time, doubt need for emergent work up of his HTN. Will get labs and CT renal, give pain/nausea meds, and reassess shortly.   6:32 PM CBC w/diff with mildly elevated WBC 12.6 with slight  neutrophilic predominance. CMP with mildly elevated gluc 118, otherwise unremarkable, kidney function preserved. Lipase WNL. U/A with large hgb, no nitrites/leuks, >50 RBCs but 0-5 WBCS and no bacteria, doesn't appear to be superimposed UTI. CT renal with 6mm obstructing stone at proximal left ureter creating mild left hydronephrosis, and bilateral nephrolithiasis. Pt feeling much better and tolerating PO well. Will give toradol before he goes, but I suspect we can try outpatient management of this stone since it's borderline size and may pass independently. Will send home with zofran, naprosyn, norco, and flomax. Urine strainer given. Advised staying hydrated. F/up with urology in 1-2wks but strict return precautions advised.  Of note, BP improving with pain control. Unclear what his BP med is supposed to be so can't give refill of this, but his BP is significantly improved currently; advised f/up with [facility] in 1wk to establish medical  care and for ongoing management of his BP, and for future refills of BP meds. DASH diet advised, advised keeping a log of BPs at home, and avoidance of things that would make BP go up at home. I explained the diagnosis and have given explicit precautions to return to the ER including for any other new or worsening symptoms. The patient understands and accepts the medical plan as it's been dictated and I have answered their questions. Discharge instructions concerning home care and prescriptions have been given. The patient is STABLE and is discharged to home in good condition.   [place] reviewed prior to dispensing controlled substance medications, and 2 year search was notable for: none found. Risks/benefits/alternatives and expectations discussed regarding controlled substances. Side effects of medications discussed. Informed consent obtained.    Final Clinical Impressions(s) / ED Diagnoses   Final diagnoses:  Ureterolithiasis  Nephrolithiasis  Left flank pain    Nausea  Essential hypertension  Neutrophilic leukocytosis    ED Discharge Orders         Ordered    ondansetron (ZOFRAN ODT) 4 MG disintegrating tablet  Every 8 hours PRN     06/30/18 1832    tamsulosin (FLOMAX) 0.4 MG CAPS capsule  Daily after supper     06/30/18 1832    naproxen (NAPROSYN) 500 MG tablet  2 times daily PRN     06/30/18 1832    HYDROcodone-acetaminophen (NORCO) 5-325 MG tablet  Every 6 hours PRN     06/30/18 1832    Strain all urine     06/30/18 [address]1832           Steve Youngberg, [place], Cordelia Poche-C 06/30/18 1852    Tilden Fossaees, Elizabeth, MD 07/02/18 1343

## 2019-08-12 IMAGING — CT CT RENAL STONE PROTOCOL
2 of 4 series · 17 of 46 positions shown, 19 images · non-contrast
Comparison: CT scan dated 06/08/2016

CLINICAL DATA: Left flank pain.

EXAM:
CT ABDOMEN AND PELVIS WITHOUT CONTRAST
TECHNIQUE: Multidetector CT imaging of the abdomen and pelvis was performed
following the standard protocol without IV contrast.

[Series 2: axial st · axial · 0.71mm/px · z∈[+947,+1337]mm · 14 of 88 slices shown, 16 images]
[im 5/88  soft-tissue]
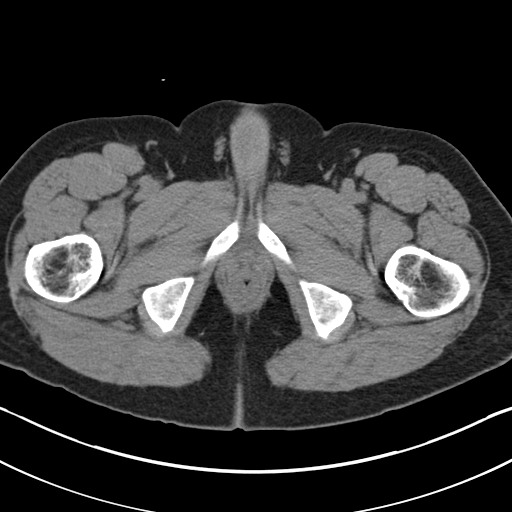
[im 5/88  bone]
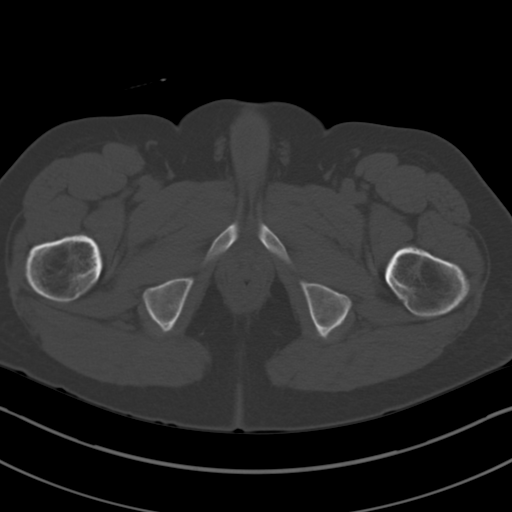
[im 10/88  soft-tissue]
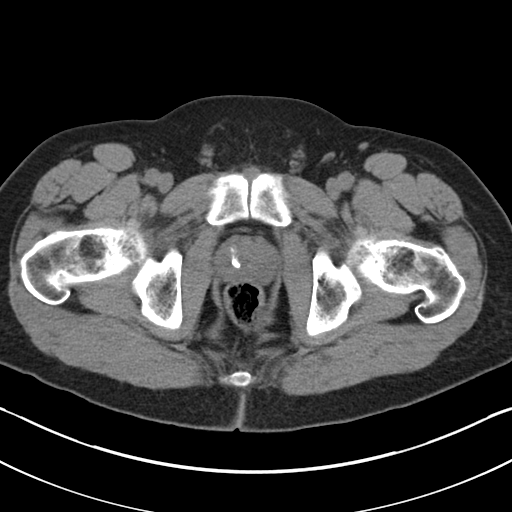
[im 20/88  soft-tissue]
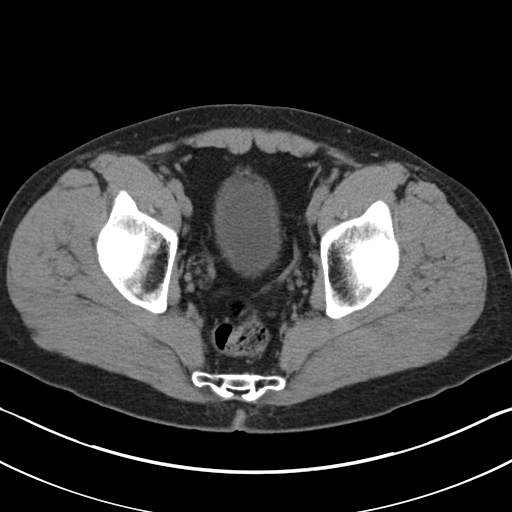
[im 25/88  soft-tissue]
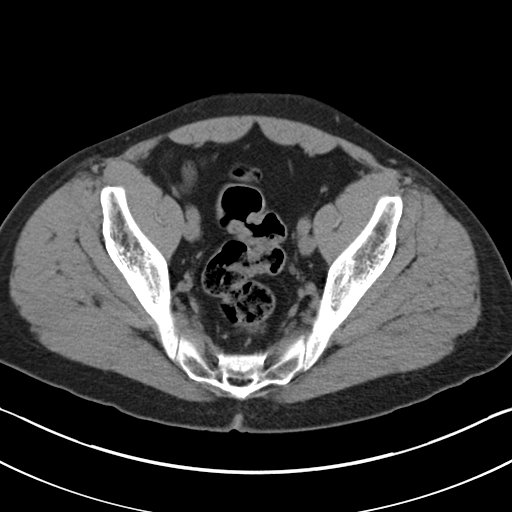
[im 30/88  soft-tissue]
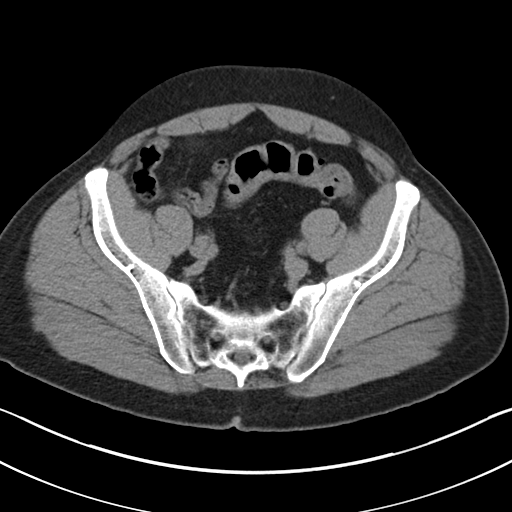
[im 34/88  soft-tissue]
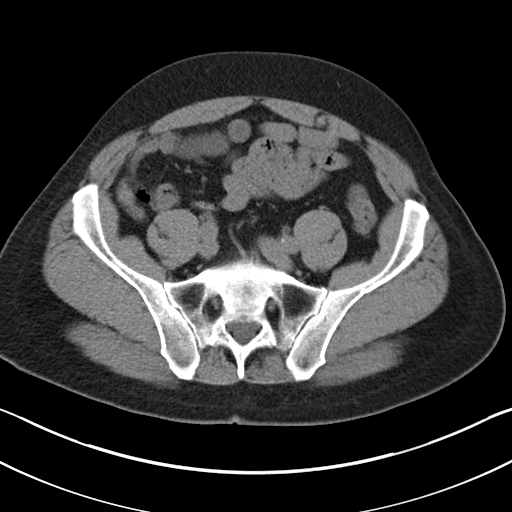
[im 39/88  soft-tissue]
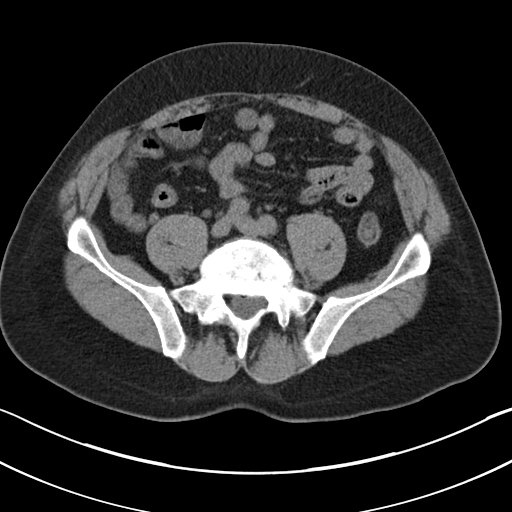
[im 49/88  soft-tissue]
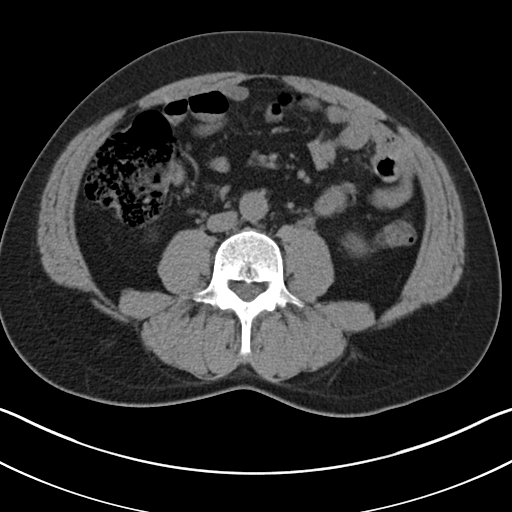
[im 54/88  soft-tissue]
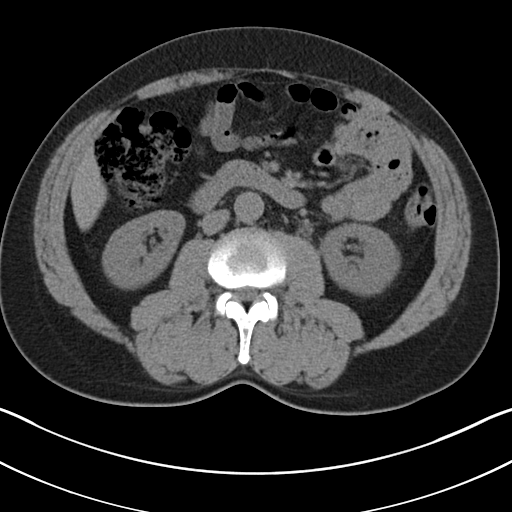
[im 54/88  bone]
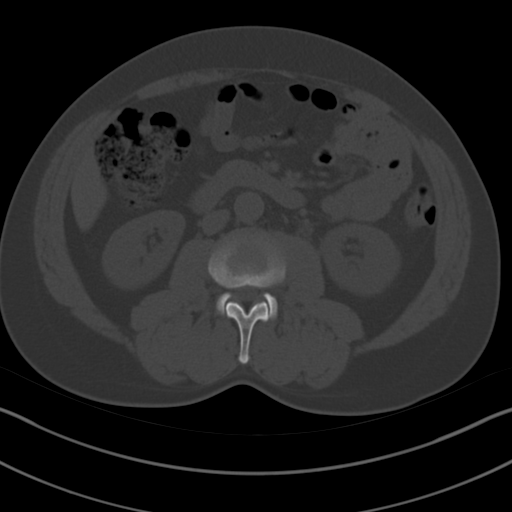
[im 59/88  soft-tissue]
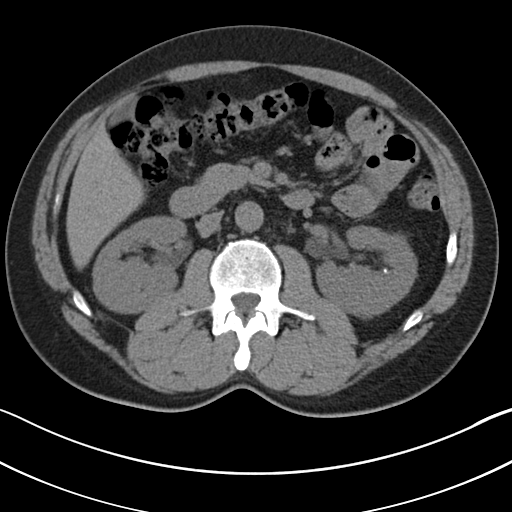
[im 63/88  soft-tissue]
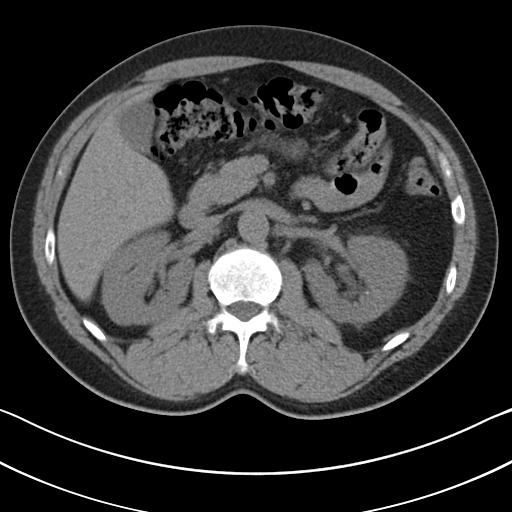
[im 68/88  soft-tissue]
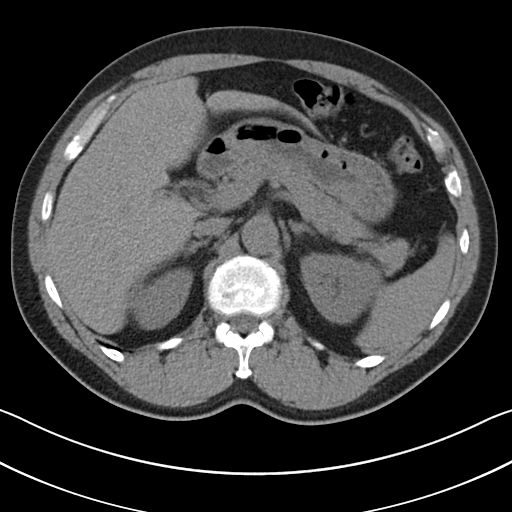
[im 78/88  soft-tissue]
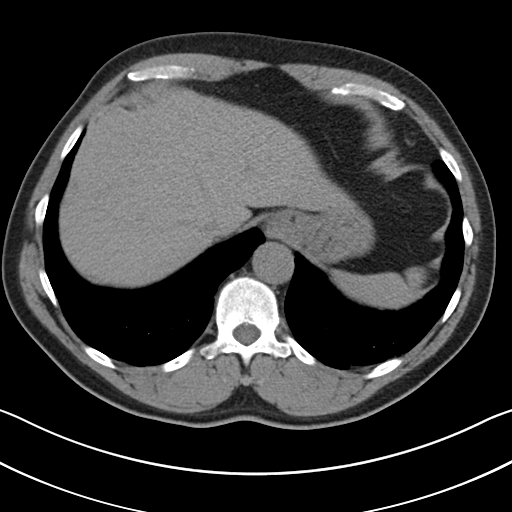
[im 83/88  soft-tissue]
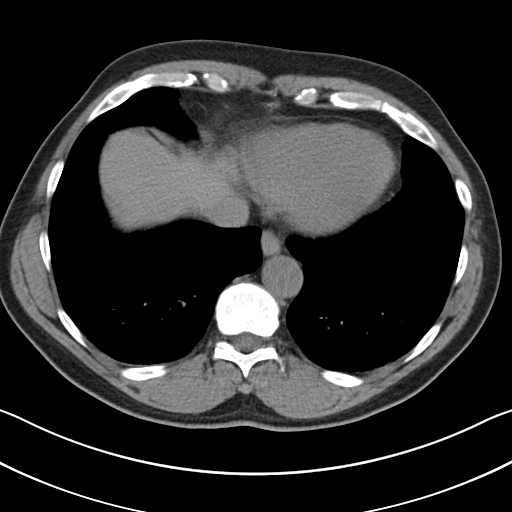

[Series 5: coronal · coronal · 0.83mm/px · 3 of 126 slices shown]
[im 42/126  soft-tissue]
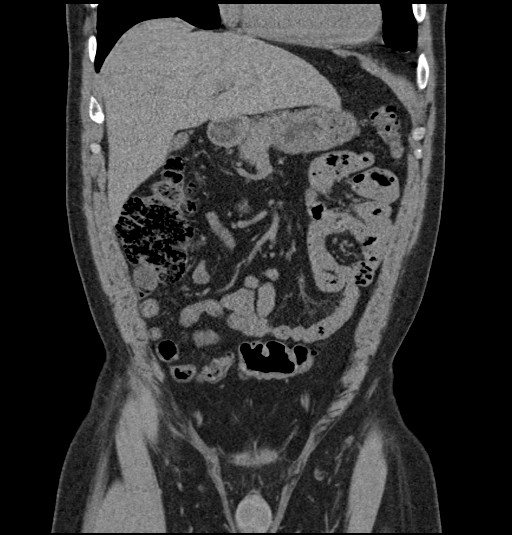
[im 56/126  soft-tissue]
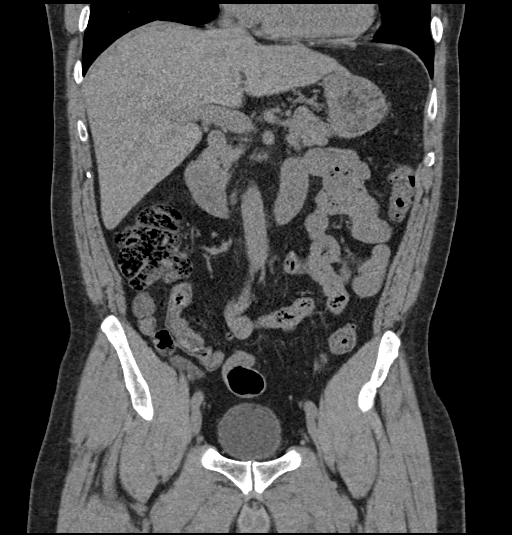
[im 70/126  soft-tissue]
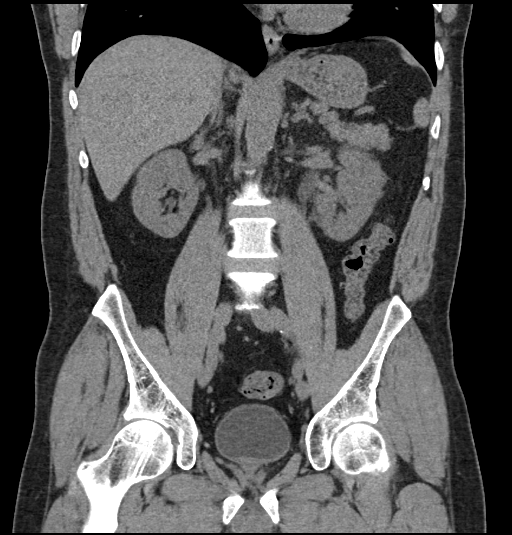

[17 of 46 positions shown; findings below may reference images not displayed]

FINDINGS: Lower chest: No acute abnormality.

Hepatobiliary: No focal liver abnormality is seen. No gallstones,
gallbladder wall thickening, or biliary dilatation.

Pancreas: Unremarkable. No pancreatic ductal dilatation or
surrounding inflammatory changes.

Spleen: Normal in size without focal abnormality.

Adrenals/Urinary Tract: New slight left hydronephrosis. 6 mm stone
in the proximal left ureter. Multiple tiny left renal calculi. One
tiny stone in the upper pole of the otherwise normal appearing right
kidney. Adrenal glands are normal. No significant abnormality of the
bladder. Minimal fat in the bladder wall at the anterior aspect of
the bladder. This is not significant.

Stomach/Bowel: Stomach is within normal limits. Appendix appears
normal. No evidence of bowel wall thickening, distention, or
inflammatory changes.

Vascular/Lymphatic: Minimal aortic atherosclerosis.  No adenopathy.

Reproductive: Benign calcifications in the normal-sized prostate
gland.

Other: No free air or ascites.  No abdominal wall hernia.

Musculoskeletal: Degenerative disc disease at L5-S1. No acute
abnormalities.
IMPRESSION: 1. 6 mm stone obstructing the proximal left ureter creating a mild
left hydronephrosis.
2. Bilateral nephrolithiasis.

## 2020-07-18 ENCOUNTER — Other Ambulatory Visit: Payer: Self-pay

## 2020-07-18 ENCOUNTER — Encounter (HOSPITAL_COMMUNITY): Payer: Self-pay | Admitting: Emergency Medicine

## 2020-07-18 ENCOUNTER — Emergency Department (HOSPITAL_COMMUNITY)
Admission: EM | Admit: 2020-07-18 | Discharge: 2020-07-18 | Disposition: A | Discharge status: Home / Self Care | Payer: Self-pay | Attending: Emergency Medicine | Admitting: Emergency Medicine

## 2020-07-18 DIAGNOSIS — Z79899 Other long term (current) drug therapy: Secondary | ICD-10-CM | POA: Insufficient documentation

## 2020-07-18 DIAGNOSIS — Y93H2 Activity, gardening and landscaping: Secondary | ICD-10-CM | POA: Insufficient documentation

## 2020-07-18 DIAGNOSIS — Y9389 Activity, other specified: Secondary | ICD-10-CM | POA: Insufficient documentation

## 2020-07-18 DIAGNOSIS — Y9289 Other specified places as the place of occurrence of the external cause: Secondary | ICD-10-CM | POA: Insufficient documentation

## 2020-07-18 DIAGNOSIS — F17210 Nicotine dependence, cigarettes, uncomplicated: Secondary | ICD-10-CM | POA: Insufficient documentation

## 2020-07-18 DIAGNOSIS — T1591XA Foreign body on external eye, part unspecified, right eye, initial encounter: Secondary | ICD-10-CM | POA: Insufficient documentation

## 2020-07-18 DIAGNOSIS — W228XXA Striking against or struck by other objects, initial encounter: Secondary | ICD-10-CM | POA: Insufficient documentation

## 2020-07-18 DIAGNOSIS — F1210 Cannabis abuse, uncomplicated: Secondary | ICD-10-CM | POA: Insufficient documentation

## 2020-07-18 DIAGNOSIS — I10 Essential (primary) hypertension: Secondary | ICD-10-CM | POA: Insufficient documentation

## 2020-07-18 DIAGNOSIS — Y998 Other external cause status: Secondary | ICD-10-CM | POA: Insufficient documentation

## 2020-07-18 DIAGNOSIS — H539 Unspecified visual disturbance: Secondary | ICD-10-CM | POA: Insufficient documentation

## 2020-07-18 MED ORDER — FLUORESCEIN SODIUM 1 MG OP STRP
1.0000 | ORAL_STRIP | Freq: Once | OPHTHALMIC | Status: AC
  Administered 2020-07-18: 1 via OPHTHALMIC
  Filled 2020-07-18: qty 1

## 2020-07-18 MED ORDER — ERYTHROMYCIN 5 MG/GM OP OINT: TOPICAL_OINTMENT | OPHTHALMIC | 0 refills | Status: DC

## 2020-07-18 MED ORDER — TETRACAINE HCL 0.5 % OP SOLN
1.0000 [drp] | Freq: Once | OPHTHALMIC | Status: AC
  Administered 2020-07-18: 1 [drp] via OPHTHALMIC
  Filled 2020-07-18: qty 4

## 2020-07-18 NOTE — Discharge Instructions (Signed)
Use antibiotic ointment as directed.  Follow-up with referred to ophthalmology doctor.  Call his office tomorrow to arrange for an appointment.  Return to emergency department for any worsening pain, fever, vision disturbance or any other worsening concerning symptoms.

## 2020-07-18 NOTE — ED Provider Notes (Signed)
[facility] [address] EMERGENCY DEPARTMENT Provider Note   CSN: [id number] Arrival date & time: 07/18/20  3009     History Chief Complaint  Patient presents with  . Eye Injury    Jeremy Smith is a 54 y.o. male past medical history of high cholesterol, hypertension who presents for evaluation of right eye pain, redness that occurred yesterday.  He reports that he was cutting wood yesterday and states that he was not wearing any eye protection.  He states that he felt like something got in his eye.  He tried to flush it out last night but states that he was unable to do so.  He states that the eye started becoming more painful, red.  He states he still able to see but does have some associated blurry vision.  He wears reading glasses but no contacts.  He denies any other trauma to the injury.  The history is provided by the patient.       Past Medical History:  Diagnosis Date  . High cholesterol   . Hypertension     There are no problems to display for this patient.   Past Surgical History:  Procedure Laterality Date  . BACK SURGERY         No family history on file.  Social History   Tobacco Use  . Smoking status: Current Every Day Smoker    Packs/day: 0.50    Types: Cigarettes  . Smokeless tobacco: Never Used  Vaping Use  . Vaping Use: Never used  Substance Use Topics  . Alcohol use: Yes    Comment: occ  . Drug use: Yes    Types: Marijuana    Home Medications Prior to Admission medications   Medication Sig Start Date End Date Taking? Authorizing Provider  diazepam (VALIUM) 5 MG tablet Take 1 tablet (5 mg total) by mouth 2 (two) times daily. Patient not taking: Reported on 05/24/2015 03/01/14   Schinlever, Santina Evans, PA-C  diphenhydramine-acetaminophen (TYLENOL PM) 25-500 MG TABS Take 1-2 tablets by mouth at bedtime as needed (sleep).    [provider]  erythromycin ophthalmic ointment Place a 1/2 inch ribbon of ointment into the lower  eyelid q6 hrs 07/18/20   Maxwell Caul, PA-C  HYDROcodone-acetaminophen (NORCO) 5-325 MG tablet Take 1 tablet by mouth every 6 (six) hours as needed for severe pain. 06/30/18   Street, [place], PA-C  HYDROcodone-acetaminophen (NORCO/VICODIN) 5-325 MG per tablet Take 1 tablet by mouth every 4 (four) hours as needed for moderate pain. Patient not taking: Reported on 05/24/2015 03/01/14   Schinlever, Santina Evans, PA-C  ibuprofen (ADVIL,MOTRIN) 800 MG tablet Take 1 tablet (800 mg total) by mouth 3 (three) times daily. Patient not taking: Reported on 05/24/2015 03/01/14   Schinlever, Santina Evans, PA-C  naproxen (NAPROSYN) 500 MG tablet Take 500 mg by mouth daily as needed. Back pain 02/19/14   Renne Crigler, PA-C  naproxen (NAPROSYN) 500 MG tablet Take 1 tablet (500 mg total) by mouth 2 (two) times daily as needed for mild pain, moderate pain or headache (TAKE WITH MEALS.). 06/30/18   Street, [place], PA-C  ondansetron (ZOFRAN ODT) 4 MG disintegrating tablet 4mg  ODT q4 hours prn nausea/vomit 06/08/16   06/10/16, MD  ondansetron (ZOFRAN ODT) 4 MG disintegrating tablet Take 1 tablet (4 mg total) by mouth every 8 (eight) hours as needed for nausea or vomiting. 06/30/18   Street, [place], PA-C  oxyCODONE-acetaminophen (PERCOCET/ROXICET) 5-325 MG tablet Take 1 tablet by mouth every 6 (six) hours as needed  for severe pain. 06/08/16   Rolan Bucco, MD  tamsulosin (FLOMAX) 0.4 MG CAPS capsule Take 1 capsule (0.4 mg total) by mouth daily. 06/08/16   Rolan Bucco, MD  tamsulosin (FLOMAX) 0.4 MG CAPS capsule Take 1 capsule (0.4 mg total) by mouth daily after supper. Take until the stone passes, then stop taking 06/30/18   Street, [place], [place]    Allergies    Patient has no known allergies.  Review of Systems   Review of Systems  Eyes: Positive for pain, redness and visual disturbance.  All other systems reviewed and are negative.   Physical Exam Updated Vital Signs BP (!) 135/104 (BP Location: Left Arm)    Pulse 88   Temp 98.9 F (37.2 C) (Oral)   Resp 16   Ht 6\' 1"  (1.854 m)   Wt 95 kg   SpO2 96%   BMI 27.63 kg/m   Physical Exam Vitals and nursing note reviewed.  Constitutional:      Appearance: He is well-developed.  HENT:     Head: Normocephalic and atraumatic.  Eyes:     General: No scleral icterus.       Right eye: No discharge.        Left eye: No discharge.     Conjunctiva/sclera:     Right eye: Right conjunctiva is injected.     Comments: Right conjunctival injection.  Pupils equal and reactive. PERRL. EOMs intact. No nystagmus. No neglect.  Right periorbital region without any overlying warmth, erythema.  Upper eyelid was inverted which showed small wood particle. Visual fields intact.   Pulmonary:     Effort: Pulmonary effort is normal.  Skin:    General: Skin is warm and dry.  Neurological:     Mental Status: He is alert.  Psychiatric:        Speech: Speech normal.        Behavior: Behavior normal.     ED Results / Procedures / Treatments   Labs (all labs ordered are listed, but only abnormal results are displayed) Labs Reviewed - No data to display  EKG None  Radiology No results found.  Procedures .Foreign Body Removal  Date/Time: 07/18/2020 11:03 AM Performed by: 07/20/2020, PA-C Authorized by: Maxwell Caul, PA-C  Body area: eye Location details: right eyelid  Anesthesia: Local Anesthetic: tetracaine drops Localization method: eyelid eversion Removal mechanism: eyelid eversion and moist cotton swab Eye examined with fluorescein. No fluorescein uptake. Dressing: antibiotic drops Depth: superficial Complexity: simple 1 objects recovered. Objects recovered: piece of wod  Post-procedure assessment: foreign body removed Patient tolerance: patient tolerated the procedure well with no immediate complications   (including critical care time)  Medications Ordered in ED Medications  tetracaine (PONTOCAINE) 0.5 % ophthalmic solution  1 drop (1 drop Both Eyes Given 07/18/20 1025)  fluorescein ophthalmic strip 1 strip (1 strip Left Eye Given 07/18/20 1025)    ED Course  I have reviewed the triage vital signs and the nursing notes.  Pertinent labs & imaging results that were available during my care of the patient were reviewed by me and considered in my medical decision making (see chart for details).    MDM Rules/Calculators/A&P                          54 year old male who presents for evaluation of right eye pain, redness that began yesterday.  He was using a saw to cut wood without using any eye protection  and states that afterwards, he felt like something was in his eye.  Since then has had pain, redness, swelling.  He does report that he has had some blurry vision but otherwise denies any other visual abnormalities.  On initial arrival, he is afebrile, nontoxic-appearing.  Vital signs are stable.  On exam, he has right conjunctival injection.  When the upper eyelid was inverted, there was a small wood particle that was seen.  Concern for corneal abrasion.  History/physical exam concerning for periorbital or orbital cellulitis.  Using a sterile wet Q-tip, the lid was inverted and small wood particle was successfully removed.  Patient reports immediate relief after foreign body was removed.  Evaluation with Joseph Art lamp showed no evidence of corneal abrasion, dendritic lesion, fluorescein uptake.  Negative Seidel sign.  Evaluation with slit lamp showed no evidence of any other foreign bodies.  No cells no flares.  Intraocular pressure as documented below:  Left IOP: 10 Right IOP: 10  At this time, patient reports improvement in pain.  No evidence of corneal abrasion or fluorescein uptake seen on exam but will plan to send him home with antibiotics for prevention. Discussed patient with Dr. Allena Katz (Optho) who will plan to see patient on an outpatient basis. At this time, patient exhibits no emergent life-threatening condition  that require further evaluation in ED or admission. Patient had ample opportunity for questions and discussion. All patient's questions were answered with full understanding. Strict return precautions discussed. Patient expresses understanding and agreement to plan.   Portions of this note were generated with Scientist, clinical (histocompatibility and immunogenetics). Dictation errors may occur despite best attempts at proofreading.  Final Clinical Impression(s) / ED Diagnoses Final diagnoses:  Foreign body of right eye, initial encounter    Rx / DC Orders ED Discharge Orders         Ordered    erythromycin ophthalmic ointment        07/18/20 1124           Rosana Hoes 07/18/20 1223    Linwood Dibbles, MD 07/19/20 [phone]

## 2020-07-18 NOTE — ED Triage Notes (Signed)
Sawdust in right eye - yesterday while cutting wood-- has flushed it, unable to relieve pain. Blurry vision

## 2020-07-20 ENCOUNTER — Emergency Department (HOSPITAL_COMMUNITY): Payer: HRSA Program

## 2020-07-20 ENCOUNTER — Other Ambulatory Visit: Payer: Self-pay

## 2020-07-20 ENCOUNTER — Emergency Department (HOSPITAL_COMMUNITY)
Admission: EM | Admit: 2020-07-20 | Discharge: 2020-07-20 | Disposition: A | Discharge status: Home / Self Care | Payer: HRSA Program | Attending: Emergency Medicine | Admitting: Emergency Medicine

## 2020-07-20 DIAGNOSIS — U071 COVID-19: Secondary | ICD-10-CM | POA: Diagnosis not present

## 2020-07-20 DIAGNOSIS — F1590 Other stimulant use, unspecified, uncomplicated: Secondary | ICD-10-CM | POA: Insufficient documentation

## 2020-07-20 DIAGNOSIS — Z79899 Other long term (current) drug therapy: Secondary | ICD-10-CM | POA: Insufficient documentation

## 2020-07-20 DIAGNOSIS — R197 Diarrhea, unspecified: Secondary | ICD-10-CM | POA: Diagnosis present

## 2020-07-20 DIAGNOSIS — R630 Anorexia: Secondary | ICD-10-CM | POA: Insufficient documentation

## 2020-07-20 DIAGNOSIS — F17210 Nicotine dependence, cigarettes, uncomplicated: Secondary | ICD-10-CM | POA: Diagnosis not present

## 2020-07-20 DIAGNOSIS — I10 Essential (primary) hypertension: Secondary | ICD-10-CM | POA: Insufficient documentation

## 2020-07-20 LAB — COMPREHENSIVE METABOLIC PANEL
ALT: 39 U/L (ref 0–44)
AST: 36 U/L (ref 15–41)
Albumin: 4.2 g/dL (ref 3.5–5.0)
Alkaline Phosphatase: 63 U/L (ref 38–126)
Anion gap: 13 (ref 5–15)
BUN: 12 mg/dL (ref 6–20)
CO2: 21 mmol/L — ABNORMAL LOW (ref 22–32)
Calcium: 8.9 mg/dL (ref 8.9–10.3)
Chloride: 102 mmol/L (ref 98–111)
Creatinine, Ser: 0.89 mg/dL (ref 0.61–1.24)
GFR calc Af Amer: >60 mL/min (ref >60)
GFR calc non Af Amer: >60 mL/min (ref >60)
Glucose, Bld: 101 mg/dL — ABNORMAL HIGH (ref 70–99)
Potassium: 3.7 mmol/L (ref 3.5–5.1)
Sodium: 136 mmol/L (ref 135–145)
Total Bilirubin: 1 mg/dL (ref 0.3–1.2)
Total Protein: 7.5 g/dL (ref 6.5–8.1)

## 2020-07-20 LAB — URINALYSIS, ROUTINE W REFLEX MICROSCOPIC
Bacteria, UA: NONE SEEN
Bilirubin Urine: NEGATIVE
Glucose, UA: NEGATIVE mg/dL
Ketones, ur: 20 mg/dL — AB
Leukocytes,Ua: NEGATIVE
Nitrite: NEGATIVE
Protein, ur: 30 mg/dL — AB
Specific Gravity, Urine: 1.014 (ref 1.005–1.030)
pH: 6 (ref 5.0–8.0)

## 2020-07-20 LAB — SARS CORONAVIRUS 2 BY RT PCR (HOSPITAL ORDER, PERFORMED IN ~~LOC~~ HOSPITAL LAB): SARS Coronavirus 2: POSITIVE — AB

## 2020-07-20 LAB — CBC
HCT: 46.8 % (ref 39.0–52.0)
Hemoglobin: 16 g/dL (ref 13.0–17.0)
MCH: 29.9 pg (ref 26.0–34.0)
MCHC: 34.2 g/dL (ref 30.0–36.0)
MCV: 87.3 fL (ref 80.0–100.0)
Platelets: 148 10*3/uL — ABNORMAL LOW (ref 150–400)
RBC: 5.36 MIL/uL (ref 4.22–5.81)
RDW: 13.2 % (ref 11.5–15.5)
WBC: 6.7 10*3/uL (ref 4.0–10.5)
nRBC: 0 % (ref 0.0–0.2)

## 2020-07-20 LAB — LIPASE, BLOOD: Lipase: 26 U/L (ref 11–51)

## 2020-07-20 MED ORDER — SODIUM CHLORIDE 0.9 % IV BOLUS
1000.0000 mL | Freq: Once | INTRAVENOUS | Status: AC
  Administered 2020-07-20: 1000 mL via INTRAVENOUS

## 2020-07-20 MED ORDER — SODIUM CHLORIDE 0.9 % IV SOLN: INTRAVENOUS | Status: DC

## 2020-07-20 NOTE — ED Triage Notes (Signed)
Pt arrived with EMS from home c/o weakness. Pt stated weaknes started 5 days ago. No complain of N/V/D. Denies Fever

## 2020-07-20 NOTE — ED Provider Notes (Addendum)
[facility]-EMERGENCY DEPT Provider Note   CSN: [id number] Arrival date & time: 07/20/20  1618     History Chief Complaint  Patient presents with   Weakness   Diarrhea   Anorexia    Jeremy Smith is a 54 y.o. male.  Patient with complaint for 5days fatigue and decreased appetite.  Had diarrhea yesterday.  None today.  On the first 2 days of symptoms he did have a fever.  But has not had fever since that time.  Patient has not had any of the Covid vaccinations.  Past medical history is significant for high cholesterol and hypertension.  Patient is a current every day smoker.  Half a pack a day.  Patient currently not on any hypertensive meds.        Past Medical History:  Diagnosis Date   High cholesterol    Hypertension     There are no problems to display for this patient.   Past Surgical History:  Procedure Laterality Date   BACK SURGERY         No family history on file.  Social History   Tobacco Use   Smoking status: Current Every Day Smoker    Packs/day: 0.50    Types: Cigarettes   Smokeless tobacco: Never Used  Vaping Use   Vaping Use: Never used  Substance Use Topics   Alcohol use: Yes    Comment: occ   Drug use: Yes    Types: Marijuana    Home Medications Prior to Admission medications   Medication Sig Start Date End Date Taking? Authorizing Provider  diphenhydramine-acetaminophen (TYLENOL PM) 25-500 MG TABS Take 1-2 tablets by mouth at bedtime as needed (sleep).   Yes [provider]  diazepam (VALIUM) 5 MG tablet Take 1 tablet (5 mg total) by mouth 2 (two) times daily. Patient not taking: Reported on 05/24/2015 03/01/14   Schinlever, Santina Evans, PA-C  erythromycin ophthalmic ointment Place a 1/2 inch ribbon of ointment into the lower eyelid q6 hrs 07/18/20   Maxwell Caul, PA-C  HYDROcodone-acetaminophen (NORCO) 5-325 MG tablet Take 1 tablet by mouth every 6 (six) hours as needed for severe pain. 06/30/18    Street, [place], PA-C  HYDROcodone-acetaminophen (NORCO/VICODIN) 5-325 MG per tablet Take 1 tablet by mouth every 4 (four) hours as needed for moderate pain. Patient not taking: Reported on 05/24/2015 03/01/14   Schinlever, Santina Evans, PA-C  ibuprofen (ADVIL,MOTRIN) 800 MG tablet Take 1 tablet (800 mg total) by mouth 3 (three) times daily. Patient not taking: Reported on 05/24/2015 03/01/14   Schinlever, Santina Evans, PA-C  naproxen (NAPROSYN) 500 MG tablet Take 500 mg by mouth daily as needed. Back pain 02/19/14   Renne Crigler, PA-C  naproxen (NAPROSYN) 500 MG tablet Take 1 tablet (500 mg total) by mouth 2 (two) times daily as needed for mild pain, moderate pain or headache (TAKE WITH MEALS.). 06/30/18   Street, [place], PA-C  ondansetron (ZOFRAN ODT) 4 MG disintegrating tablet 4mg  ODT q4 hours prn nausea/vomit Patient not taking: Reported on 07/20/2020 06/08/16   06/10/16, MD  ondansetron (ZOFRAN ODT) 4 MG disintegrating tablet Take 1 tablet (4 mg total) by mouth every 8 (eight) hours as needed for nausea or vomiting. 06/30/18   Street, [place], PA-C  oxyCODONE-acetaminophen (PERCOCET/ROXICET) 5-325 MG tablet Take 1 tablet by mouth every 6 (six) hours as needed for severe pain. 06/08/16   06/10/16, MD  tamsulosin (FLOMAX) 0.4 MG CAPS capsule Take 1 capsule (0.4 mg total) by mouth daily. 06/08/16  Rolan Bucco, MD  tamsulosin (FLOMAX) 0.4 MG CAPS capsule Take 1 capsule (0.4 mg total) by mouth daily after supper. Take until the stone passes, then stop taking Patient not taking: Reported on 07/20/2020 06/30/18   Street, [place], PA-C    Allergies    Bee venom  Review of Systems   Review of Systems  Constitutional: Positive for appetite change, fatigue and fever. Negative for chills.  HENT: Negative for congestion, rhinorrhea and sore throat.   Eyes: Negative for visual disturbance.  Respiratory: Negative for cough and shortness of breath.   Cardiovascular: Negative for chest pain and leg  swelling.  Gastrointestinal: Positive for diarrhea. Negative for abdominal pain, nausea and vomiting.  Genitourinary: Negative for dysuria.  Musculoskeletal: Negative for back pain and neck pain.  Skin: Negative for rash.  Neurological: Negative for dizziness, light-headedness and headaches.  Hematological: Does not bruise/bleed easily.  Psychiatric/Behavioral: Negative for confusion.    Physical Exam Updated Vital Signs BP (!) 140/96    Pulse 80    Temp 100.3 F (37.9 C) (Oral)    Resp 15    Ht 1.854 m (6\' 1" )    Wt 88.5 kg    SpO2 97%    BMI 25.73 kg/m   Physical Exam Vitals and nursing note reviewed.  Constitutional:      General: He is not in acute distress.    Appearance: Normal appearance. He is well-developed.  HENT:     Head: Normocephalic and atraumatic.  Eyes:     Extraocular Movements: Extraocular movements intact.     Conjunctiva/sclera: Conjunctivae normal.     Pupils: Pupils are equal, round, and reactive to light.  Cardiovascular:     Rate and Rhythm: Normal rate and regular rhythm.     Heart sounds: No murmur heard.   Pulmonary:     Effort: Pulmonary effort is normal. No respiratory distress.     Breath sounds: Normal breath sounds.  Abdominal:     Palpations: Abdomen is soft.     Tenderness: There is no abdominal tenderness.  Musculoskeletal:        General: No swelling. Normal range of motion.     Cervical back: Normal range of motion and neck supple.  Skin:    General: Skin is warm and dry.     Capillary Refill: Capillary refill takes less than 2 seconds.  Neurological:     General: No focal deficit present.     Mental Status: He is alert and oriented to person, place, and time.     Cranial Nerves: No cranial nerve deficit.     Sensory: No sensory deficit.     Motor: No weakness.     ED Results / Procedures / Treatments   Labs (all labs ordered are listed, but only abnormal results are displayed) Labs Reviewed  SARS CORONAVIRUS 2 BY RT PCR  (HOSPITAL ORDER, PERFORMED IN [facility]) - Abnormal; Notable for the following components:      Result Value   SARS Coronavirus 2 POSITIVE (*)    All other components within normal limits  COMPREHENSIVE METABOLIC PANEL - Abnormal; Notable for the following components:   CO2 21 (*)    Glucose, Bld 101 (*)    All other components within normal limits  CBC - Abnormal; Notable for the following components:   Platelets 148 (*)    All other components within normal limits  URINALYSIS, ROUTINE W REFLEX MICROSCOPIC - Abnormal; Notable for the following components:   Hgb  urine dipstick LARGE (*)    Ketones, ur 20 (*)    Protein, ur 30 (*)    All other components within normal limits  URINE CULTURE  LIPASE, BLOOD    EKG None  Radiology DG Chest 1 View  Result Date: 07/20/2020 CLINICAL DATA:  Weakness EXAM: CHEST  1 VIEW COMPARISON:  None. FINDINGS: The heart size and mediastinal contours are within normal limits. Both lungs are clear. The visualized skeletal structures are unremarkable. IMPRESSION: No active disease. Electronically Signed   By: Jasmine Pang M.D.   On: 07/20/2020 20:02    Procedures Procedures (including critical care time)  Medications Ordered in ED Medications  0.9 %  sodium chloride infusion (has no administration in time range)  sodium chloride 0.9 % bolus 1,000 mL (1,000 mLs Intravenous New Bag/Given 07/20/20 2200)    ED Course  I have reviewed the triage vital signs and the nursing notes.  Pertinent labs & imaging results that were available during my care of the patient were reviewed by me and considered in my medical decision making (see chart for details).    MDM Rules/Calculators/A&P                          Urine sent for culture.  But not classic for urinary tract infection.  Clinically was suspicious that patient may be Covid positive.  And testing is back as positive.  Patient not hypoxic in no acute distress.  Oxygen saturations 95% or  better on room air.  Submitted patient's name to see if he meets criteria for the monoclonal antibody.  Patient stable for discharge home.  Chest x-ray without any acute findings.'s only been symptomatic for 5 days.  Patient's blood pressure elevated as well.  In the past had a history of hypertension does not appear to be on any antihypertensive meds.  But in face of the Covid infection will have patient hold off and follow-up with wellness clinic when better.  May need to be started on hypertensive meds.  Final Clinical Impression(s) / ED Diagnoses Final diagnoses:  COVID-19 virus infection    Rx / DC Orders ED Discharge Orders    None       Vanetta Mulders, MD 07/20/20 2252    Vanetta Mulders, MD 07/20/20 [phone]

## 2020-07-20 NOTE — ED Notes (Signed)
Pt states having diarrhea last night. No appetite in couple weeks.

## 2020-07-20 NOTE — Discharge Instructions (Addendum)
Name is been submitted to see if you meet criteria for monoclonal antibody infusion.  Self isolate.  Return for any shortness of breath or difficulty breathing.  Also noted today that blood pressure is elevated.  When you improved from the Covid infection follow-up with the wellness clinic.  You may need to be started on antihypertensive medications.

## 2020-07-21 ENCOUNTER — Telehealth: Payer: Self-pay | Admitting: Family

## 2020-07-21 ENCOUNTER — Other Ambulatory Visit: Payer: Self-pay | Admitting: Family

## 2020-07-21 DIAGNOSIS — I10 Essential (primary) hypertension: Secondary | ICD-10-CM

## 2020-07-21 DIAGNOSIS — Z6825 Body mass index (BMI) 25.0-25.9, adult: Secondary | ICD-10-CM

## 2020-07-21 DIAGNOSIS — U071 COVID-19: Secondary | ICD-10-CM

## 2020-07-21 LAB — URINE CULTURE: Culture: <10000 — AB

## 2020-07-21 NOTE — Progress Notes (Signed)
I connected by phone with Jeremy Smith on 07/21/2020 at 2:32 PM to discuss the potential use of a new treatment for mild to moderate COVID-19 viral infection in non-hospitalized patients.  This patient is a 54 y.o. male that meets the FDA criteria for Emergency Use Authorization of COVID monoclonal antibody casirivimab/imdevimab.  Has a (+) direct SARS-CoV-2 viral test result  Has mild or moderate COVID-19   Is NOT hospitalized due to COVID-19  Is within 10 days of symptom onset  Has at least one of the high risk factor(s) for progression to severe COVID-19 and/or hospitalization as defined in EUA.  Specific high risk criteria : BMI > 25 and Cardiovascular disease or hypertension   I have spoken and communicated the following to the patient or parent/caregiver regarding COVID monoclonal antibody treatment:  1. FDA has authorized the emergency use for the treatment of mild to moderate COVID-19 in adults and pediatric patients with positive results of direct SARS-CoV-2 viral testing who are 66 years of age and older weighing at least 40 kg, and who are at high risk for progressing to severe COVID-19 and/or hospitalization.  2. The significant known and potential risks and benefits of COVID monoclonal antibody, and the extent to which such potential risks and benefits are unknown.  3. Information on available alternative treatments and the risks and benefits of those alternatives, including clinical trials.  4. Patients treated with COVID monoclonal antibody should continue to self-isolate and use infection control measures (e.g., wear mask, isolate, social distance, avoid sharing personal items, clean and disinfect "high touch" surfaces, and frequent handwashing) according to CDC guidelines.   5. The patient or parent/caregiver has the option to accept or refuse COVID monoclonal antibody treatment.  After reviewing this information with the patient, The patient agreed to proceed with  receiving casirivimab\imdevimab infusion and will be provided a copy of the Fact sheet prior to receiving the infusion.   Jeanine Luz, NP 07/21/2020 2:32 PM

## 2020-07-21 NOTE — Telephone Encounter (Signed)
Called to Discuss with patient about Covid symptoms and the use of the monoclonal antibody infusion for those with mild to moderate Covid symptoms and at a high risk of hospitalization.     Pt appears to qualify for this infusion due to co-morbid conditions and/or a member of an at-risk group in accordance with the FDA Emergency Use Authorization.   Jeremy Smith was seen at [facility] ED on 9/1 with fatigue and headache which he continues to have. COVID positive. Symptoms started on 8/27. Risk factors include hypertension and BMI > 25.  Discussed treatment with Regeneron and he wishes to continue.   Hello Jeremy Smith,   You have been scheduled to receive Regeneron (the monoclonal antibody we discussed) on : 07/23/20 at 12:30pm  If you have been tested outside of a [facility] - you MUST bring a copy of your positive test with you the morning of your appointment. You may take a photo of this and upload to your MyChart portal or have the testing facility fax the result to [phone]    The address for the infusion clinic site is:  --GPS address is [address] - the parking is located near [employer] building where you will see  COVID19 Infusion feather banner marking the entrance to parking.   (see photos below)            --Enter into the 2nd entrance where the "wave, flag banner" is at the road. Turn into this 2nd entrance and immediately turn left to park in 1 of the 5 parking spots.   --Please stay in your car and call the desk for assistance inside [phone].   --Average time in department is roughly 2 hours for Regeneron treatment - this includes preparation of the medication, IV start and the required 1 hour monitoring after the infusion.    Should you develop worsening shortness of breath, chest pain or severe breathing problems please do not wait for this appointment and go to the Emergency room for evaluation and treatment. You will undergo another oxygen screen  before your infusion to ensure this is the best treatment option for you. There is a chance that the best decision may be to send you to the Emergency Room for evaluation at the time of your appointment.   The day of your visit you should: Marland Kitchen Get plenty of rest the night before and drink plenty of water . Eat a light meal/snack before coming and take your medications as prescribed  . Wear warm, comfortable clothes with a shirt that can roll-up over the elbow (will need IV start).  . Wear a mask  . Consider bringing some activity to help pass the time  Many commercial insurers are waiving bills related to COVID treatment however some have ranged from $300-640. We are starting to see some insurers send bills to patients later for the administration of the medication - we are learning more information but you may receive a bill after your appointment.  Please contact your insurance agent to discuss prior to your appointment if you would like further details about billing specific to your policy.    Jeremy Smith, Jeremy Smith 07/21/2020 2:31 PM

## 2020-07-23 ENCOUNTER — Ambulatory Visit (HOSPITAL_COMMUNITY)
Admission: RE | Admit: 2020-07-23 | Discharge: 2020-07-23 | Disposition: A | Discharge status: Home / Self Care | Payer: HRSA Program | Source: Ambulatory Visit | Attending: Pulmonary Disease | Admitting: Pulmonary Disease

## 2020-07-23 DIAGNOSIS — I10 Essential (primary) hypertension: Secondary | ICD-10-CM

## 2020-07-23 DIAGNOSIS — U071 COVID-19: Secondary | ICD-10-CM | POA: Diagnosis present

## 2020-07-23 DIAGNOSIS — Z6825 Body mass index (BMI) 25.0-25.9, adult: Secondary | ICD-10-CM | POA: Diagnosis present

## 2020-07-23 MED ORDER — EPINEPHRINE 0.3 MG/0.3ML IJ SOAJ: 0.3000 mg | Freq: Once | INTRAMUSCULAR | Status: DC | PRN

## 2020-07-23 MED ORDER — ALBUTEROL SULFATE HFA 108 (90 BASE) MCG/ACT IN AERS: 2.0000 | INHALATION_SPRAY | Freq: Once | RESPIRATORY_TRACT | Status: DC | PRN

## 2020-07-23 MED ORDER — SODIUM CHLORIDE 0.9 % IV SOLN
1200.0000 mg | Freq: Once | INTRAVENOUS | Status: AC
  Administered 2020-07-23: 1200 mg via INTRAVENOUS
  Filled 2020-07-23: qty 10

## 2020-07-23 MED ORDER — FAMOTIDINE IN NACL 20-0.9 MG/50ML-% IV SOLN: 20.0000 mg | Freq: Once | INTRAVENOUS | Status: DC | PRN

## 2020-07-23 MED ORDER — METHYLPREDNISOLONE SODIUM SUCC 125 MG IJ SOLR: 125.0000 mg | Freq: Once | INTRAMUSCULAR | Status: DC | PRN

## 2020-07-23 MED ORDER — SODIUM CHLORIDE 0.9 % IV SOLN: INTRAVENOUS | Status: DC | PRN

## 2020-07-23 MED ORDER — DIPHENHYDRAMINE HCL 50 MG/ML IJ SOLN: 50.0000 mg | Freq: Once | INTRAMUSCULAR | Status: DC | PRN

## 2020-07-23 NOTE — Progress Notes (Signed)
  Diagnosis: COVID-19  Physician: Wright MD  Procedure: Covid Infusion Clinic Med: casirivimab\imdevimab infusion - Provided patient with casirivimab\imdevimab fact sheet for patients, parents and caregivers prior to infusion.  Complications: No immediate complications noted.  Discharge: Discharged home   Jeremy Smith 07/23/2020   

## 2020-07-23 NOTE — Discharge Instructions (Signed)

## 2021-09-01 IMAGING — DX DG CHEST 1V
1 series · 1 of 1 positions shown · non-contrast
Comparison: None.

CLINICAL DATA: Weakness

EXAM:
CHEST  1 VIEW

[chest ap]
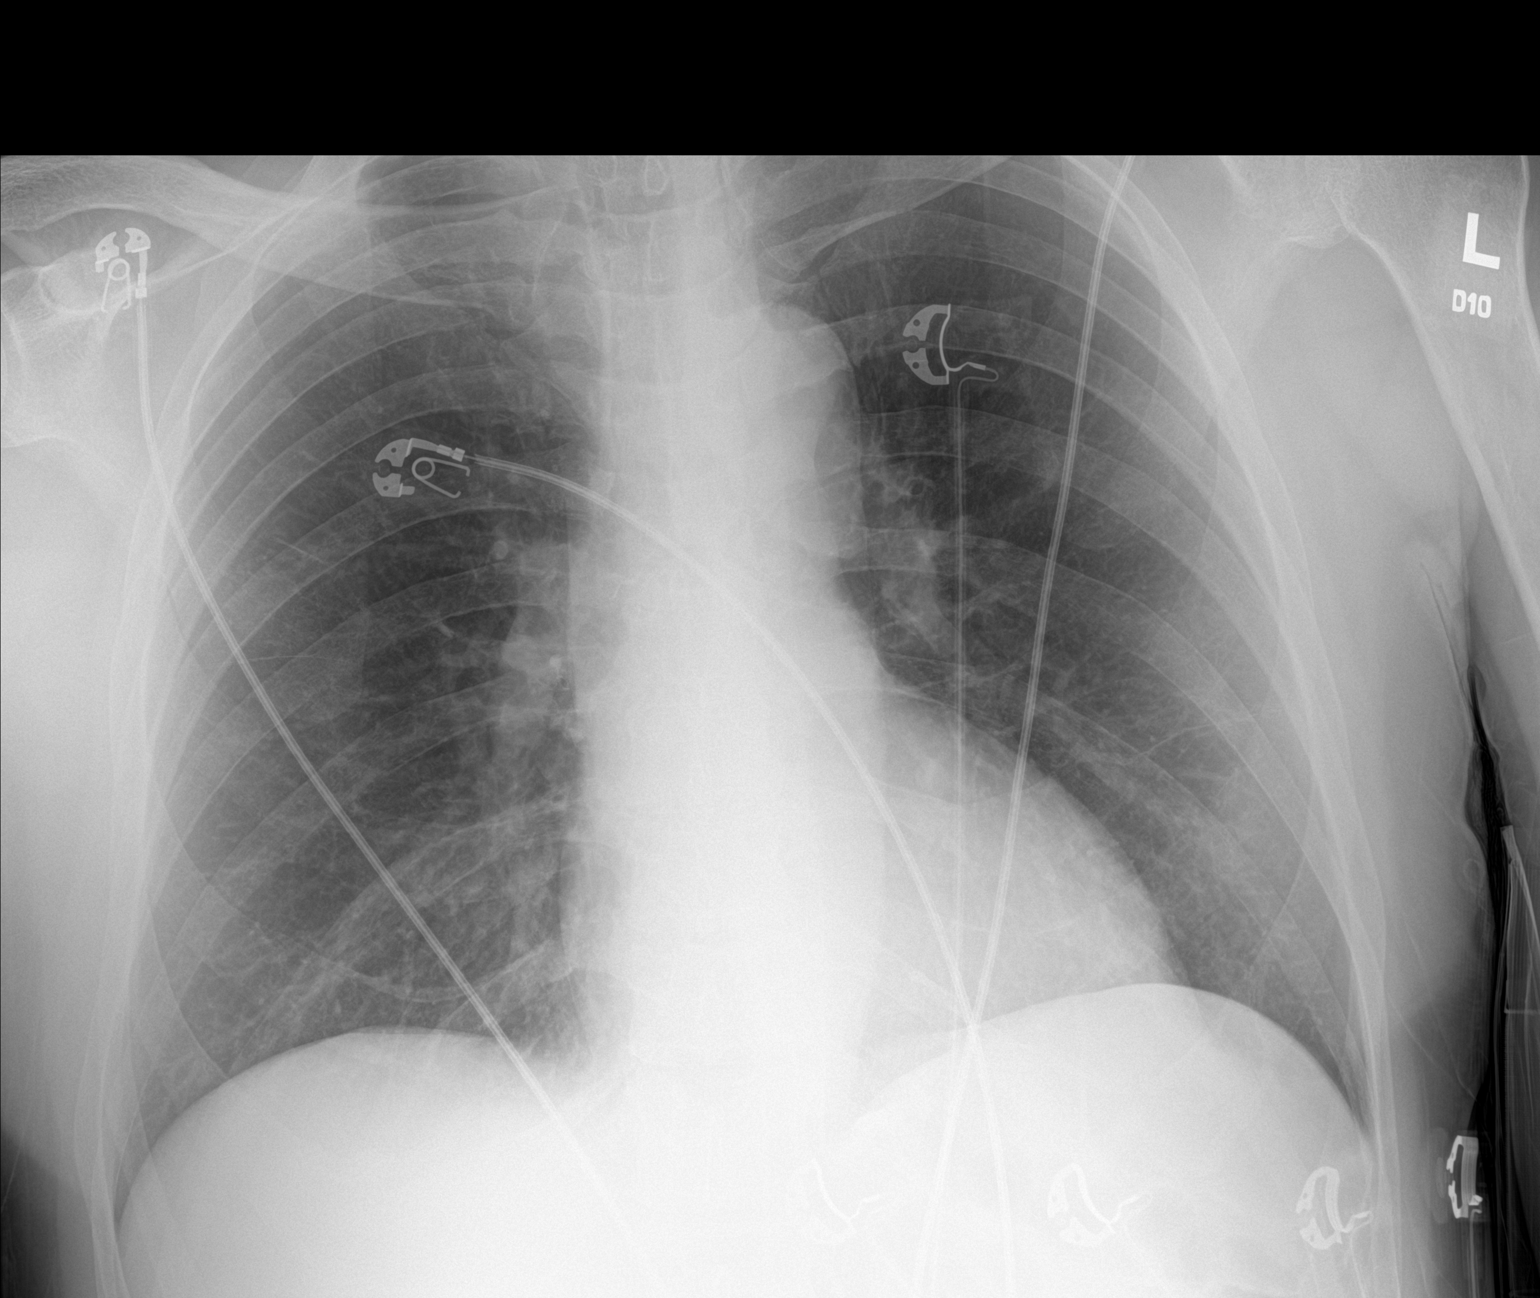

[1 of 1 positions shown; findings below may reference images not displayed]

FINDINGS: The heart size and mediastinal contours are within normal limits.
Both lungs are clear. The visualized skeletal structures are
unremarkable.
IMPRESSION: No active disease.

## 2022-11-21 ENCOUNTER — Telehealth: Payer: Self-pay

## 2022-11-21 NOTE — Telephone Encounter (Signed)
LVM. AS, CMA 

## 2022-11-29 ENCOUNTER — Ambulatory Visit: Payer: Medicaid Other | Admitting: Student

## 2022-11-29 ENCOUNTER — Other Ambulatory Visit: Payer: Self-pay

## 2022-11-29 ENCOUNTER — Encounter: Payer: Self-pay | Admitting: Student

## 2022-11-29 VITALS — BP 176/96 | HR 64 | Temp 98.2°F | Ht 72.0 in | Wt 207.7 lb

## 2022-11-29 DIAGNOSIS — M5442 Lumbago with sciatica, left side: Secondary | ICD-10-CM | POA: Diagnosis not present

## 2022-11-29 DIAGNOSIS — H538 Other visual disturbances: Secondary | ICD-10-CM | POA: Insufficient documentation

## 2022-11-29 DIAGNOSIS — F17210 Nicotine dependence, cigarettes, uncomplicated: Secondary | ICD-10-CM

## 2022-11-29 DIAGNOSIS — R739 Hyperglycemia, unspecified: Secondary | ICD-10-CM | POA: Diagnosis not present

## 2022-11-29 DIAGNOSIS — E785 Hyperlipidemia, unspecified: Secondary | ICD-10-CM

## 2022-11-29 DIAGNOSIS — Z1159 Encounter for screening for other viral diseases: Secondary | ICD-10-CM | POA: Diagnosis not present

## 2022-11-29 DIAGNOSIS — Z8639 Personal history of other endocrine, nutritional and metabolic disease: Secondary | ICD-10-CM | POA: Diagnosis not present

## 2022-11-29 DIAGNOSIS — Z114 Encounter for screening for human immunodeficiency virus [HIV]: Secondary | ICD-10-CM

## 2022-11-29 DIAGNOSIS — Z23 Encounter for immunization: Secondary | ICD-10-CM

## 2022-11-29 DIAGNOSIS — H547 Unspecified visual loss: Secondary | ICD-10-CM

## 2022-11-29 DIAGNOSIS — M549 Dorsalgia, unspecified: Secondary | ICD-10-CM | POA: Insufficient documentation

## 2022-11-29 DIAGNOSIS — I10 Essential (primary) hypertension: Secondary | ICD-10-CM

## 2022-11-29 DIAGNOSIS — G8929 Other chronic pain: Secondary | ICD-10-CM | POA: Diagnosis not present

## 2022-11-29 DIAGNOSIS — Z1211 Encounter for screening for malignant neoplasm of colon: Secondary | ICD-10-CM

## 2022-11-29 NOTE — Assessment & Plan Note (Signed)
Worsening over the last 2 to 3 years.  Has trouble reading things close to face like his phone.  Does not have trouble with objects that are further away.  Reading glasses given him headaches.  Suspect presbyopia rather than more sinister organic causes.  Referral to optometry placed today.

## 2022-11-29 NOTE — Progress Notes (Addendum)
Subjective:  [name] is a 57 y.o. who presents to clinic for back pain, need for eye exam, and to establish care.  Back pain Ruptured disc in 2007-08 Confined to bed for a long time after that Surgery for this in 08, which enabled walking but has been in pain since Since moving to [place] in 2010 has been without consistent care for this, occasionally visiting ED for unbearable pain Finally has insurance, wants to get routine care for this, interested in disability benefits.  Vision problems over the last 2-3 years Problem with reading things up close, like phone Tried reading glasses but difficulty finding the right ones, sometimes gives him headaches.  Review of Systems  Constitutional:  Negative for chills, fever and weight loss.  Respiratory:  Negative for cough, hemoptysis and shortness of breath.   Cardiovascular:  Negative for chest pain.  Gastrointestinal:  Positive for heartburn. Negative for abdominal pain, blood in stool, diarrhea, nausea and vomiting.  Genitourinary: Negative.   Psychiatric/Behavioral: Negative.     Patient Active Problem List   Diagnosis Date Noted   Hyperlipidemia 11/30/2022   Back pain 11/29/2022   Blurry vision 11/29/2022   Hypertension 11/29/2022   Medical history: Chronic back pain Hypertension Kidney stones - 2017, none since then High cholesterol Pertinent negatives: diabetes, cancer  Family history: Reviewed in medical record. Family history unknown as this patient was adopted and does not know biological relatives.  Social history: Reviewed in medical record. Lives with wife in [place]. Spends days watching television, enjoys playing video games like [place]. 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 Drinks a six-pack per week. Smokes marijuana occasionally. Remote history of cocaine use but none recently. No illicit opioids, prescribed or otherwise. Smokes 3 cigarettes per day,  currently trying to quit, smoked 1.5 ppd as recently as 4 years ago. Started smoking at 40.    Objective:   Vitals:   11/29/22 1114  BP: (!) 176/96  Pulse: 64  Temp: 98.2 F (36.8 C)  TempSrc: Oral  SpO2: 96%  Weight: 207 lb 11.2 oz (94.2 kg)  Height: 6' (1.829 m)    Physical Exam Constitutional:      General: He is not in acute distress.    Appearance: Normal appearance.  Cardiovascular:     Rate and Rhythm: Normal rate and regular rhythm.     Pulses: Normal pulses.  Pulmonary:     Effort: Pulmonary effort is normal.     Breath sounds: Normal breath sounds. No stridor.  Abdominal:     Palpations: Abdomen is soft.     Tenderness: There is no abdominal tenderness.  Musculoskeletal:     Right lower leg: No edema.     Left lower leg: No edema.  Lymphadenopathy:     Cervical: No cervical adenopathy.  Skin:    General: Skin is warm and dry.  Neurological:     Mental Status: He is alert. Mental status is at baseline.     Motor: Weakness (LLE weak relative to RLE) present.     Deep Tendon Reflexes: Reflexes normal.  Psychiatric:        Mood and Affect: Mood normal.        Behavior: Behavior normal.     Assessment & Plan:  The primary encounter diagnosis was Primary hypertension. Diagnoses of Hyperglycemia, Chronic left-sided low back pain with left-sided sciatica, Vision problem, History of hyperlipidemia, Screening for HIV (human immunodeficiency virus), Encounter for  hepatitis C screening test for low risk patient, Screen for colon cancer, Need for immunization against influenza, Blurry vision, and Hyperlipidemia, unspecified hyperlipidemia type were also pertinent to this visit.  Back pain Chronic left lower back pain associated with sciatica.  Has been out of work since approximately 2008.  Since moving to this area in 2010 he has been without consistent care for this problem due to gaps in insurance coverage.  His pain is reported to be due to lumbar disc issues.  He  is status post surgery for this problem in 2010.  There are no red flag findings today.  His exam is notable for weakness of the left leg relative to the right and intact reflexes.  His pain has been managed effectively at a pain clinic in the past.  I will refer him to a pain medicine clinic today.  Blurry vision Worsening over the last 2 to 3 years.  Has trouble reading things close to face like his phone.  Does not have trouble with objects that are further away.  Reading glasses given him headaches.  Suspect presbyopia rather than more sinister organic causes.  Referral to optometry placed today.  Hypertension History of hypertension.  Self reports treatment in the past with a diuretic and 1 other antihypertensive.  Has not been on those medicines for some time now.  Blood pressure today in clinic is 176/96.  BMP today.  Will restart antihypertensives pending those results.  Addendum 11/30/2022 BMP routine Start losartan-HCTZ 50-12.5 mg daily  Hyperlipidemia Addendum 11/30/2022 Lipid Panel     Component Value Date/Time   CHOL 217 (H) 11/29/2022 1211   TRIG 244 (H) 11/29/2022 1211   HDL 40 11/29/2022 1211   CHOLHDL 5.4 (H) 11/29/2022 1211   LDLCALC 133 (H) 11/29/2022 1211   LABVLDL 44 (H) 11/29/2022 1211  10-year ASCVD risk is 20.8%.  Recommend smoking cessation, and to start atorvastatin 20 mg daily.    Return in 2-3 months for routine follow up.  Patient discussed with Dr. Loree Fee MD 11/30/2022, 11:06 AM  Pager: [phone]

## 2022-11-29 NOTE — Assessment & Plan Note (Addendum)
History of hypertension.  Self reports treatment in the past with a diuretic and 1 other antihypertensive.  Has not been on those medicines for some time now.  Blood pressure today in clinic is 176/96.  BMP today.  Will restart antihypertensives pending those results.  Addendum 11/30/2022 BMP routine Start losartan-HCTZ 50-12.5 mg daily

## 2022-11-29 NOTE — Patient Instructions (Signed)
Return in 2-3 months for routine follow up.   I will call you with the results of the following laboratory test(s):  Lab Orders         BMP8+Anion Gap         Lipid Profile         HIV antibody (with reflex)         Hepatitis C Ab reflex to Quant PCR         POC Hbg A1C      Expect a call from the following department(s):  Referral Orders         Ambulatory referral to Pain Clinic         Ambulatory referral to Optometry         Ambulatory referral to Gastroenterology     Please call our clinic at [phone] Monday through Friday from 9 am to 4 pm if you have questions or concerns about your health. If after hours or on the weekend, call the main hospital number and ask for the Internal Medicine Resident On-Call. If you need medication refills, please notify your pharmacy one week in advance and they will send [place] a request.   Best, Nani Gasser, [place]

## 2022-11-29 NOTE — Assessment & Plan Note (Signed)
Chronic left lower back pain associated with sciatica.  Has been out of work since approximately 2008.  Since moving to this area in 2010 he has been without consistent care for this problem due to gaps in insurance coverage.  His pain is reported to be due to lumbar disc issues.  He is status post surgery for this problem in 2010.  There are no red flag findings today.  His exam is notable for weakness of the left leg relative to the right and intact reflexes.  His pain has been managed effectively at a pain clinic in the past.  I will refer him to a pain medicine clinic today.

## 2022-11-30 ENCOUNTER — Telehealth: Payer: Self-pay

## 2022-11-30 DIAGNOSIS — E785 Hyperlipidemia, unspecified: Secondary | ICD-10-CM | POA: Insufficient documentation

## 2022-11-30 LAB — LIPID PANEL
Chol/HDL Ratio: 5.4 ratio — ABNORMAL HIGH (ref 0.0–5.0)
Cholesterol, Total: 217 mg/dL — ABNORMAL HIGH (ref 100–199)
HDL: 40 mg/dL (ref >39)
LDL Chol Calc (NIH): 133 mg/dL — ABNORMAL HIGH (ref 0–99)
Triglycerides: 244 mg/dL — ABNORMAL HIGH (ref 0–149)
VLDL Cholesterol Cal: 44 mg/dL — ABNORMAL HIGH (ref 5–40)

## 2022-11-30 LAB — HCV INTERPRETATION

## 2022-11-30 LAB — BMP8+ANION GAP
Anion Gap: 14 mmol/L (ref 10.0–18.0)
BUN/Creatinine Ratio: 16 (ref 9–20)
BUN: 15 mg/dL (ref 6–24)
CO2: 21 mmol/L (ref 20–29)
Calcium: 9.7 mg/dL (ref 8.7–10.2)
Chloride: 105 mmol/L (ref 96–106)
Creatinine, Ser: 0.94 mg/dL (ref 0.76–1.27)
Glucose: 86 mg/dL (ref 70–99)
Potassium: 4 mmol/L (ref 3.5–5.2)
Sodium: 140 mmol/L (ref 134–144)
eGFR: 96 mL/min/{1.73_m2} (ref >59)

## 2022-11-30 LAB — HEMOGLOBIN A1C
Est. average glucose Bld gHb Est-mCnc: 123 mg/dL
Hgb A1c MFr Bld: 5.9 % — ABNORMAL HIGH (ref 4.8–5.6)

## 2022-11-30 LAB — HCV AB W REFLEX TO QUANT PCR: HCV Ab: NONREACTIVE

## 2022-11-30 LAB — HIV ANTIBODY (ROUTINE TESTING W REFLEX): HIV Screen 4th Generation wRfx: NONREACTIVE

## 2022-11-30 MED ORDER — LOSARTAN POTASSIUM-HCTZ 50-12.5 MG PO TABS: 1.0000 | ORAL_TABLET | Freq: Every day | ORAL | 2 refills | Status: DC

## 2022-11-30 MED ORDER — ATORVASTATIN CALCIUM 20 MG PO TABS: 20.0000 mg | ORAL_TABLET | Freq: Every day | ORAL | 11 refills | Status: DC

## 2022-11-30 NOTE — Progress Notes (Signed)
Lab results for high cholesterol, hemoglobin A1c in prediabetic range.  Current 10-year ASCVD risk is 20.8%.  I recommend starting losartan-HCTZ 50-12.5 mg daily in addition to atorvastatin 20 mg daily.  This patient is amenable to this plan.  I also recommend follow-up in 1 month to assess response to therapy and repeat BMP and lipid panel.

## 2022-11-30 NOTE — Addendum Note (Signed)
Addended by: Nani Gasser on: 11/30/2022 11:07 AM   Modules accepted: Orders

## 2022-11-30 NOTE — Assessment & Plan Note (Signed)
Addendum 11/30/2022 Lipid Panel     Component Value Date/Time   CHOL 217 (H) 11/29/2022 1211   TRIG 244 (H) 11/29/2022 1211   HDL 40 11/29/2022 1211   CHOLHDL 5.4 (H) 11/29/2022 1211   LDLCALC 133 (H) 11/29/2022 1211   LABVLDL 44 (H) 11/29/2022 1211  10-year ASCVD risk is 20.8%.  Recommend smoking cessation, and to start atorvastatin 20 mg daily.

## 2022-11-30 NOTE — Telephone Encounter (Signed)
Requesting lab results, please call pt back.  

## 2022-11-30 NOTE — Progress Notes (Signed)
Internal Medicine Clinic Attending  Case discussed with Dr. McLendon  At the time of the visit.  We reviewed the resident's history and exam and pertinent patient test results.  I agree with the assessment, diagnosis, and plan of care documented in the resident's note.  

## 2022-12-04 ENCOUNTER — Encounter: Payer: Self-pay | Admitting: Physical Medicine and Rehabilitation

## 2022-12-10 ENCOUNTER — Encounter: Payer: Medicaid Other | Admitting: Physical Medicine and Rehabilitation

## 2023-01-01 NOTE — Progress Notes (Signed)
HPI Pain Inventory Average Pain 8 Pain Right Now 8 My pain is aching  In the last 24 hours, has pain interfered with the following? General activity 7 Relation with others 7 Enjoyment of life 7 What TIME of day is your pain at its worst? varies Sleep (in general) Poor  Pain is worse with: walking, bending, sitting, inactivity, and standing Pain improves with: rest Relief from Meds:  .  walk without assistance use a cane how many minutes can you walk? 3 ability to climb steps?  yes do you drive?  no  not employed: date last employed 2010 I need assistance with the following:  household duties and shopping  weakness numbness trouble walking depression  Any changes since last visit?  no  Any changes since last visit?  no    Family History  Adopted: Yes  Family history unknown: Yes   Social History   Socioeconomic History   Marital status: Single    Spouse name: Not on file   Number of children: Not on file   Years of education: Not on file   Highest education level: Not on file  Occupational History   Not on file  Tobacco Use   Smoking status: Every Day    Packs/day: 0.50    Types: Cigarettes   Smokeless tobacco: Never  Vaping Use   Vaping Use: Never used  Substance and Sexual Activity   Alcohol use: Yes    Comment: occ   Drug use: Yes    Types: Marijuana   Sexual activity: Not on file  Other Topics Concern   Not on file  Social History Narrative   Lives with wife in [place]. Spends days watching television, enjoys playing video games like [place]. 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 Drinks a six-pack per week. Smokes marijuana occasionally. Remote history of cocaine use but none recently. No illicit opioids, prescribed or otherwise. Smokes 3 cigarettes per day, currently trying to quit, smoked 1.5 ppd as recently as 4 years ago. Started smoking at 29.   Social Determinants of Health   Financial Resource  Strain: Not on file  Food Insecurity: No Food Insecurity (11/29/2022)   Hunger Vital Sign    Worried About Running Out of Food in the Last Year: Never true    Ran Out of Food in the Last Year: Never true  Transportation Needs: No Transportation Needs (11/29/2022)   PRAPARE - Hydrologist (Medical): No    Lack of Transportation (Non-Medical): No  Physical Activity: Not on file  Stress: Not on file  Social Connections: Moderately Isolated (11/29/2022)   Social Connection and Isolation Panel [NHANES]    Frequency of Communication with Friends and Family: More than three times a week    Frequency of Social Gatherings with Friends and Family: More than three times a week    Attends Religious Services: Never    Marine scientist or Organizations: No    Attends Music therapist: Never    Marital Status: Married   Past Surgical History:  Procedure Laterality Date   BACK SURGERY     Past Medical History:  Diagnosis Date   Chronic pain    High cholesterol    Hypertension    Nephrolithiasis    Ht 6' (1.829 m)   Wt 205 lb (93 kg)   BMI 27.80 kg/m   Opioid Risk Score:   Fall Risk Score:  `1  Depression screen [facility] 2/9     11/29/2022   11:10 AM  Depression screen PHQ 2/9  Decreased Interest 0  Down, Depressed, Hopeless 0  PHQ - 2 Score 0  Altered sleeping 0  Tired, decreased energy 0  Change in appetite 0  Feeling bad or failure about yourself  0  Trouble concentrating 0  Moving slowly or fidgety/restless 0  Suicidal thoughts 0  PHQ-9 Score 0  Difficult doing work/chores Not difficult at all    Jeremy Smith is a 57 y.o. year old male  who  has a past medical history of Chronic pain, High cholesterol, Hypertension, and Nephrolithiasis.   They are presenting to PM&R clinic as a new patient for pain management evaluation. They were referred by Nani Gasser MD for treatment of L sided low back pain.   Per their documentation:  "Chronic left lower back pain associated with sciatica. Has been out of work since approximately 2008. Since moving to this area in 2010 he has been without consistent care for this problem due to gaps in insurance coverage. His pain is reported to be due to lumbar disc issues. He is status post surgery for this problem in 2010. There are no red flag findings today. His exam is notable for weakness of the left leg relative to the right and intact reflexes. His pain has been managed effectively at a pain clinic in the past. I will refer him to a pain medicine clinic today."  No recent imaging on file to review  Source: Midline low back pain with radiation down the left leg with lateral thigh constant ache. Also pain in bilateral shoulders, R>L, starting 1 month ago. +Weakness in LLE with lack of movement in 3 toes.  Inciting incident: Post-discectomy 2008 Description of pain: constant and aching Severity: On average 7 /10. At worst 10 /10. At best 7 /10. Exacerbating factors:  raining weather, bending forward, bending backwards, sitting, activity, and cold Remitting factors: pain medication Red flag symptoms: No red flags for back pain endorsed in Hx or ROS  Medications tried: Topical medications (moderate effect) : salonPas Nsaids (never tried) : Aleve, ibuprofen Tylenol  (mild effect) : Tylenol PM 1 tablet PRN QHS uses 2-3x per week and will put him to sleep Opiates  (excellent effect) : none per PDMP. Vicodin or Percocet in the ER as needed, last a few years ago.  Gabapentin / Lyrica  (never tried) :  TCAs  (no effect) : Had elavil in the past from a friend; he woke up very dehydrated and disoriented.  SNRIs  (never tried) :  Other  (no effect) : Tried muscle relaxers in the past  Other treatments: PT/OT  (never tried) :  Accupuncture/chiropractor/massage  (no effect) : chiropractor, before the surgery.  TENs unit (never tried) :  Injections (no effect) : 2x prior to surgery Surgery  (moderate effect) : S/p disc surgery in 2008, which allowed walking but also significant post-op pain, which he has dealt with. He didn't have insurance before to consider additional surgery.  Other  (never tried) :   Goals for pain control: "I would like to play with the grandkids, sit down and watch a program without having to stand up every few minutes"   Has a disability claim active; was denied several times because he wasn't in treatment.   Prior UDS results: No results found for: "LABOPIA", "COCAINSCRNUR", "LABBENZ", "AMPHETMU", "THCU", "LABBARB"   Working on quitting smoking, a 3 cigarettes per day. Alcohol 2  drinks per week. Smokes some weed, has tried cocaine several years before.   ROS: Review of Systems  Musculoskeletal:  Positive for back pain and neck pain.   PE: Constitution: Appropriate appearance for age. No apparent distress  HEENT: PERRL, EOMI grossly intact.  Resp: CTAB. No rales, rhonchi, or wheezing. Cardio: RRR. No mumurs, rubs, or gallops. No peripheral edema. Abdomen: Nondistended. Nontender. +bowel sounds. Psych: Appropriate mood and affect. Neuro: AAOx4. No apparent deficits   Neurologic Exam:   DTRs: Reflexes were more brisk on L compared to R patella, bilateral 2+ achilles Babinsky: flexor responses b/l.   Sensory exam: revealed normal sensation in all dermatomal regions in bilateral lower extremities Motor exam: strength 5/5 throughout right lower extremity and with exception of LLE 4/5 throughout, with 2/5 in lateral 3-5th digits Coordination: + Mild LLE ataxia.   Gait: +antalgic gait favoring R leg; +trendelenberg, mild  Assessment and Plan: Jeremy Smith is a 57 y.o. year old male  who  has a past medical history of Chronic pain, High cholesterol, Hypertension, and Nephrolithiasis.   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   Chronic pain syndrome Assessment & Plan: Non-narcotic  management for now; patient would need to abstain from [facility] use if pain not controlled by other methods.   Follow up in 2 months.   Orders: -     Ambulatory referral to Physical Therapy  Chronic bilateral low back pain with left-sided sciatica Assessment & Plan: I have referred you to physical therapy for your back pain and gait mechanics   Orders: -     Ambulatory referral to Physical Therapy  Left leg weakness Assessment & Plan: Likely d/t chronic nerve impingement prior to disc surgery 2008; has improved initially and then remained stable post-op, with pain increasing.   Start Gabapentin 300 mg three times daily. Call me after 1-2 weeks ot let me know the effect of this medication.   Orders: -     Ambulatory referral to Physical Therapy  Impaired mobility and ADLs -     Ambulatory referral to Physical Therapy  Other orders -     Gabapentin; Take 1 capsule (300 mg total) by mouth 3 (three) times daily.  Dispense: 90 capsule; Refill: 5

## 2023-01-02 ENCOUNTER — Encounter: Payer: Self-pay | Admitting: Physical Medicine and Rehabilitation

## 2023-01-02 ENCOUNTER — Encounter
Payer: Medicaid Other | Attending: Physical Medicine and Rehabilitation | Admitting: Physical Medicine and Rehabilitation

## 2023-01-02 VITALS — BP 168/91 | HR 69 | Ht 72.0 in | Wt 205.0 lb

## 2023-01-02 DIAGNOSIS — G8929 Other chronic pain: Secondary | ICD-10-CM | POA: Insufficient documentation

## 2023-01-02 DIAGNOSIS — Z7409 Other reduced mobility: Secondary | ICD-10-CM | POA: Insufficient documentation

## 2023-01-02 DIAGNOSIS — R29898 Other symptoms and signs involving the musculoskeletal system: Secondary | ICD-10-CM | POA: Diagnosis not present

## 2023-01-02 DIAGNOSIS — M5442 Lumbago with sciatica, left side: Secondary | ICD-10-CM | POA: Diagnosis not present

## 2023-01-02 DIAGNOSIS — Z789 Other specified health status: Secondary | ICD-10-CM | POA: Insufficient documentation

## 2023-01-02 DIAGNOSIS — G894 Chronic pain syndrome: Secondary | ICD-10-CM | POA: Diagnosis not present

## 2023-01-02 MED ORDER — GABAPENTIN 300 MG PO CAPS: 300.0000 mg | ORAL_CAPSULE | Freq: Three times a day (TID) | ORAL | 5 refills | Status: DC

## 2023-01-02 NOTE — Patient Instructions (Signed)
I have referred you to physical therapy for your back pain and gait mechanics  Start Gabapentin 300 mg three times daily. Call me after 1-2 weeks ot let me know the effect of this medication.   Follow up in 2 months.

## 2023-01-05 NOTE — Progress Notes (Incomplete)
HPI Pain Inventory Average Pain 8 Pain Right Now 8 My pain is aching  In the last 24 hours, has pain interfered with the following? General activity 7 Relation with others 7 Enjoyment of life 7 What TIME of day is your pain at its worst? varies Sleep (in general) Poor  Pain is worse with: walking, bending, sitting, inactivity, and standing Pain improves with: rest Relief from Meds:  .  walk without assistance use a cane how many minutes can you walk? 3 ability to climb steps?  yes do you drive?  no  not employed: date last employed 2010 I need assistance with the following:  household duties and shopping  weakness numbness trouble walking depression  Any changes since last visit?  no  Any changes since last visit?  no    Family History  Adopted: Yes  Family history unknown: Yes   Social History   Socioeconomic History  . Marital status: Single    Spouse name: Not on file  . Number of children: Not on file  . Years of education: Not on file  . Highest education level: Not on file  Occupational History  . Not on file  Tobacco Use  . Smoking status: Every Day    Packs/day: 0.50    Types: Cigarettes  . Smokeless tobacco: Never  Vaping Use  . Vaping Use: Never used  Substance and Sexual Activity  . Alcohol use: Yes    Comment: occ  . Drug use: Yes    Types: Marijuana  . Sexual activity: Not on file  Other Topics Concern  . Not on file  Social History Narrative   Lives with wife in [place]. Spends days watching television, enjoys playing video games like [place]. 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 Drinks a six-pack per week. Smokes marijuana occasionally. Remote history of cocaine use but none recently. No illicit opioids, prescribed or otherwise. Smokes 3 cigarettes per day, currently trying to quit, smoked 1.5 ppd as recently as 4 years ago. Started smoking at 46.   Social Determinants of Health    Financial Resource Strain: Not on file  Food Insecurity: No Food Insecurity (11/29/2022)   Hunger Vital Sign   . Worried About Charity fundraiser in the Last Year: Never true   . Ran Out of Food in the Last Year: Never true  Transportation Needs: No Transportation Needs (11/29/2022)   PRAPARE - Transportation   . Lack of Transportation (Medical): No   . Lack of Transportation (Non-Medical): No  Physical Activity: Not on file  Stress: Not on file  Social Connections: Moderately Isolated (11/29/2022)   Social Connection and Isolation Panel [NHANES]   . Frequency of Communication with Friends and Family: More than three times a week   . Frequency of Social Gatherings with Friends and Family: More than three times a week   . Attends Religious Services: Never   . Active Member of Clubs or Organizations: No   . Attends Archivist Meetings: Never   . Marital Status: Married   Past Surgical History:  Procedure Laterality Date  . BACK SURGERY     Past Medical History:  Diagnosis Date  . Chronic pain   . High cholesterol   . Hypertension   . Nephrolithiasis    Ht 6' (1.829 m)   Wt 205 lb (93 kg)   BMI 27.80 kg/m   Opioid Risk Score:   Fall Risk Score:  `1  Depression screen [facility] 2/9     11/29/2022   11:10 AM  Depression screen PHQ 2/9  Decreased Interest 0  Down, Depressed, Hopeless 0  PHQ - 2 Score 0  Altered sleeping 0  Tired, decreased energy 0  Change in appetite 0  Feeling bad or failure about yourself  0  Trouble concentrating 0  Moving slowly or fidgety/restless 0  Suicidal thoughts 0  PHQ-9 Score 0  Difficult doing work/chores Not difficult at all    Jeremy Smith is a 57 y.o. year old male  who  has a past medical history of Chronic pain, High cholesterol, Hypertension, and Nephrolithiasis.   They are presenting to PM&R clinic as a new patient for pain management evaluation. They were referred by Nani Gasser MD for treatment of L sided low back  pain.   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 Has been out of work since approximately 2008. Since moving to this area in 2010 he has been without consistent care for this problem due to gaps in insurance coverage. His pain is reported to be due to lumbar disc issues. He is status post surgery for this problem in 2010. There are no red flag findings today. His exam is notable for weakness of the left leg relative to the right and intact reflexes. His pain has been managed effectively at a pain clinic in the past. I will refer him to a pain medicine clinic today."  No recent imaging on file to review  Source: Midline low back pain with radiation down the left leg with lateral thigh constant ache. Also pain in bilateral shoulders, R>L, starting 1 month ago. +Weakness in LLE with lack of movement in 3 toes.  Inciting incident: Post-discectomy 2008 Description of pain: constant and aching Severity: On average 7 /10. At worst 10 /10. At best 7 /10. Exacerbating factors:  raining weather, bending forward, bending backwards, sitting, activity, and cold Remitting factors: pain medication Red flag symptoms: No red flags for back pain endorsed in Hx or ROS  Medications tried: Topical medications (moderate effect) : salonPas Nsaids (never tried) : Aleve, ibuprofen Tylenol  (mild effect) : Tylenol PM 1 tablet PRN QHS uses 2-3x per week and will put him to sleep Opiates  (excellent effect) : none per PDMP. Vicodin or Percocet in the ER as needed, last a few years ago.  Gabapentin / Lyrica  (never tried) :  TCAs  (no effect) : Had elavil in the past from a friend; he woke up very dehydrated and disoriented.  SNRIs  (never tried) :  Other  (no effect) : Tried muscle relaxers in the past  Other treatments: PT/OT  (never tried) :  Accupuncture/chiropractor/massage  (no effect) : chiropractor, before the surgery.  TENs unit (never tried) :  Injections (no effect)  : 2x prior to surgery Surgery (moderate effect) : S/p disc surgery in 2008, which allowed walking but also significant post-op pain, which he has dealt with. He didn't have insurance before to consider additional surgery.  Other  (never tried) :   Goals for pain control: "I would like to play with the grandkids, sit down and watch a program without having to stand up every few minutes"   Has a disability claim active; was denied several times because he wasn't in treatment.   Prior UDS results: No results found for: "LABOPIA", "COCAINSCRNUR", "LABBENZ", "AMPHETMU", "THCU", "LABBARB"   Working on quitting smoking, a 3 cigarettes per day. Alcohol 2  drinks per week. Smokes some weed, has tried cocaine several years before.   ROS: Review of Systems  Musculoskeletal:  Positive for back pain and neck pain.   PE: Constitution: Appropriate appearance for age. No apparent distress  HEENT: PERRL, EOMI grossly intact.  Resp: CTAB. No rales, rhonchi, or wheezing. Cardio: RRR. No mumurs, rubs, or gallops. No peripheral edema. Abdomen: Nondistended. Nontender. +bowel sounds. Psych: Appropriate mood and affect. Neuro: AAOx4. No apparent deficits   Neurologic Exam:   DTRs: Reflexes were more brisk on L compared to R patella, bilateral 2+ achilles Babinsky: flexor responses b/l.   Sensory exam: revealed normal sensation in all dermatomal regions in bilateral lower extremities Motor exam: strength 5/5 throughout right lower extremity and with exception of LLE 4/5 throughout, with 2/5 in lateral 3-5th digits Coordination: + Mild LLE ataxia.   Gait: +antalgic gait favoring R leg; +trendelenberg, mild  Assessment and Plan: Jeremy Smith is a 58 y.o. year old male  who  has a past medical history of Chronic pain, High cholesterol, Hypertension, and Nephrolithiasis.   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   There are no diagnoses  linked to this encounter.

## 2023-01-05 NOTE — Assessment & Plan Note (Signed)
Likely d/t chronic nerve impingement prior to disc surgery 2008; has improved initially and then remained stable post-op, with pain increasing.   Start Gabapentin 300 mg three times daily. Call me after 1-2 weeks ot let me know the effect of this medication.

## 2023-01-05 NOTE — Assessment & Plan Note (Signed)
Non-narcotic management for now; patient would need to abstain from [facility] use if pain not controlled by other methods.   Follow up in 2 months.

## 2023-01-05 NOTE — Assessment & Plan Note (Signed)
I have referred you to physical therapy for your back pain and gait mechanics

## 2023-01-08 NOTE — Therapy (Signed)
OUTPATIENT PHYSICAL THERAPY NEURO EVALUATION   Patient Name: Jeremy Smith MRN: [id number] DOB:06-01-1966, 57 y.o., male Today's Date: 01/08/2023   PCP: Nani Gasser, MD  REFERRING PROVIDER:  Gertie Gowda, DO  END OF SESSION:   Past Medical History:  Diagnosis Date   Chronic pain    High cholesterol    Hypertension    Nephrolithiasis    Past Surgical History:  Procedure Laterality Date   BACK SURGERY     Patient Active Problem List   Diagnosis Date Noted   Chronic pain syndrome 01/02/2023   Left leg weakness 01/02/2023   Impaired mobility and ADLs 01/02/2023   Hyperlipidemia 11/30/2022   Back pain 11/29/2022   Blurry vision 11/29/2022   Hypertension 11/29/2022    ONSET DATE: 01/02/2023  REFERRING DIAG: G89.4 (ICD-10-CM) - Chronic pain syndromeM54.42,G89.29 (ICD-10-CM) - Chronic bilateral low back pain with left-sided sciaticaR29.898 (ICD-10-CM) - Left leg weaknessZ74.09,Z78.9 (ICD-10-CM) - Impaired mobility and ADLs  THERAPY DIAG:  No diagnosis found.  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 Has been out of work since approximately 2008   PAIN:  Are you having pain? {OPRCPAIN:27236}  PRECAUTIONS: {Therapy precautions:24002}  WEIGHT BEARING RESTRICTIONS: {Yes ***/No:24003}  FALLS: Has patient fallen in last 6 months? {fallsyesno:27318}  LIVING ENVIRONMENT: Lives with: {OPRC lives with:25569::"lives with their family"} Lives in: {Lives in:25570} Stairs: {opstairs:27293} Has following equipment at home: {Assistive devices:23999}  PLOF:  {PLOF:24004}  PATIENT GOALS: ***  OBJECTIVE:   DIAGNOSTIC FINDINGS: ***  COGNITION: Overall cognitive status: {cognition:24006}   SENSATION: {sensation:27233}  COORDINATION: ***  EDEMA:  {edema:24020}  MUSCLE TONE: {LE tone:25568}  MUSCLE LENGTH: Hamstrings: Right *** deg; Left *** deg Thomas test: Right *** deg; Left *** deg  DTRs:  {DTR SITE:24025}  POSTURE: {posture:25561}  LOWER EXTREMITY ROM:     {AROM/PROM:27142}  Right Eval Left Eval  Hip flexion    Hip extension    Hip abduction    Hip adduction    Hip internal rotation    Hip external rotation    Knee flexion    Knee extension    Ankle dorsiflexion    Ankle plantarflexion    Ankle inversion    Ankle eversion     (Blank rows = not  tested)  LOWER EXTREMITY MMT:    MMT Right Eval Left Eval  Hip flexion    Hip extension    Hip abduction    Hip adduction    Hip internal rotation    Hip external rotation    Knee flexion    Knee extension    Ankle dorsiflexion    Ankle plantarflexion    Ankle inversion    Ankle eversion    (Blank rows = not tested)  BED MOBILITY:  {Bed mobility:24027}  TRANSFERS: Assistive device utilized: {Assistive devices:23999}  Sit to stand: {Levels of assistance:24026} Stand to sit: {Levels of assistance:24026} Chair to chair: {Levels of assistance:24026} Floor: {Levels of assistance:24026}  RAMP:  Level of Assistance: {Levels of assistance:24026} Assistive device utilized: {Assistive devices:23999} Ramp Comments: ***  CURB:  Level of Assistance: {Levels of assistance:24026} Assistive device utilized: {Assistive devices:23999} Curb Comments: ***  STAIRS: Level of Assistance: {Levels of assistance:24026} Stair Negotiation Technique: {Stair Technique:27161} with {Rail Assistance:27162} Number of Stairs: ***  Height of Stairs: ***  Comments: ***  GAIT: Gait pattern: {gait characteristics:25376} Distance walked: *** Assistive device utilized:  {Assistive devices:23999} Level of assistance: {Levels of assistance:24026} Comments: ***  FUNCTIONAL TESTS:  {Functional tests:24029}  PATIENT SURVEYS:  {rehab surveys:24030}  TODAY'S TREATMENT:                                                                                                                              DATE: ***    PATIENT EDUCATION: Education details: *** Person educated: {Person educated:25204} Education method: {Education Method:25205} Education comprehension: {Education Comprehension:25206}  HOME EXERCISE PROGRAM: ***  GOALS: Goals reviewed with patient? {yes/no:20286}  SHORT TERM GOALS: Target date: ***  *** Baseline: Goal status: {GOALSTATUS:25110}  2.  *** Baseline:  Goal status: {GOALSTATUS:25110}  3.  *** Baseline:  Goal status: {GOALSTATUS:25110}  4.  *** Baseline:  Goal status: {GOALSTATUS:25110}  5.  *** Baseline:  Goal status: {GOALSTATUS:25110}  6.  *** Baseline:  Goal status: {GOALSTATUS:25110}  LONG TERM GOALS: Target date: ***  *** Baseline:  Goal status: {GOALSTATUS:25110}  2.  *** Baseline:  Goal status: {GOALSTATUS:25110}  3.  *** Baseline:  Goal status: {GOALSTATUS:25110}  4.  *** Baseline:  Goal status: {GOALSTATUS:25110}  5.  *** Baseline:  Goal status: {GOALSTATUS:25110}  6.  *** Baseline:  Goal status: {GOALSTATUS:25110}  ASSESSMENT:  CLINICAL IMPRESSION: Patient is a *** y.o. *** who was seen today for physical therapy evaluation and treatment for ***.   OBJECTIVE IMPAIRMENTS: {opptimpairments:25111}.   ACTIVITY LIMITATIONS: {activitylimitations:27494}  PARTICIPATION LIMITATIONS: {participationrestrictions:25113}  PERSONAL FACTORS: {Personal factors:25162} are also affecting patient's functional outcome.   REHAB POTENTIAL: {rehabpotential:25112}  CLINICAL DECISION MAKING: {clinical decision making:25114}  EVALUATION COMPLEXITY: {Evaluation complexity:25115}  PLAN:  PT  FREQUENCY: {rehab frequency:25116}  PT DURATION: {rehab duration:25117}  PLANNED INTERVENTIONS: {rehab planned interventions:25118::"Therapeutic exercises","Therapeutic activity","Neuromuscular re-education","Balance training","Gait training","Patient/Family education","Self Care","Joint mobilization"}  PLAN FOR NEXT SESSION: ***   Arliss Journey, PT, DPT  01/08/2023,  3:16 PM

## 2023-01-09 ENCOUNTER — Ambulatory Visit: Payer: Medicaid Other | Attending: Physical Medicine and Rehabilitation | Admitting: Physical Therapy

## 2023-01-09 ENCOUNTER — Encounter: Payer: Self-pay | Admitting: Physical Therapy

## 2023-01-09 VITALS — BP 153/99 | HR 62

## 2023-01-09 DIAGNOSIS — M5442 Lumbago with sciatica, left side: Secondary | ICD-10-CM | POA: Diagnosis not present

## 2023-01-09 DIAGNOSIS — M6281 Muscle weakness (generalized): Secondary | ICD-10-CM | POA: Diagnosis not present

## 2023-01-09 DIAGNOSIS — Z789 Other specified health status: Secondary | ICD-10-CM | POA: Insufficient documentation

## 2023-01-09 DIAGNOSIS — G894 Chronic pain syndrome: Secondary | ICD-10-CM | POA: Insufficient documentation

## 2023-01-09 DIAGNOSIS — R2689 Other abnormalities of gait and mobility: Secondary | ICD-10-CM | POA: Diagnosis not present

## 2023-01-09 DIAGNOSIS — G8929 Other chronic pain: Secondary | ICD-10-CM | POA: Diagnosis not present

## 2023-01-09 DIAGNOSIS — Z7409 Other reduced mobility: Secondary | ICD-10-CM | POA: Diagnosis not present

## 2023-01-09 DIAGNOSIS — M5459 Other low back pain: Secondary | ICD-10-CM | POA: Diagnosis not present

## 2023-01-09 DIAGNOSIS — R293 Abnormal posture: Secondary | ICD-10-CM | POA: Diagnosis not present

## 2023-01-09 DIAGNOSIS — R29898 Other symptoms and signs involving the musculoskeletal system: Secondary | ICD-10-CM | POA: Insufficient documentation

## 2023-01-10 ENCOUNTER — Encounter: Payer: Medicaid Other | Admitting: Internal Medicine

## 2023-01-14 NOTE — Progress Notes (Deleted)
   CC: HTN follow up  HPI:  [name] is a 57 y.o. with medical history of HTN, HLD presenting to [facility] for follow on HTN. Established care last month on 11/29/22 and was starting on HTN meds.   Please see problem-based list for further details, assessments, and plans.  Past Medical History:  Diagnosis Date   Chronic pain    High cholesterol    Hypertension    Nephrolithiasis      Current Outpatient Medications (Cardiovascular):    atorvastatin (LIPITOR) 20 MG tablet, Take 1 tablet (20 mg total) by mouth daily.   losartan-hydrochlorothiazide (HYZAAR) 50-12.5 MG tablet, Take 1 tablet by mouth daily.   Current Outpatient Medications (Analgesics):    ibuprofen (ADVIL,MOTRIN) 800 MG tablet, Take 1 tablet (800 mg total) by mouth 3 (three) times daily.   Current Outpatient Medications (Other):    diphenhydramine-acetaminophen (TYLENOL PM) 25-500 MG TABS, Take 1-2 tablets by mouth at bedtime as needed (sleep).   gabapentin (NEURONTIN) 300 MG capsule, Take 1 capsule (300 mg total) by mouth 3 (three) times daily.  Review of Systems:  Review of system negative unless stated in the problem list or HPI.    Physical Exam:  There were no vitals filed for this visit.  Physical Exam General: NAD HENT: NCAT Lungs: CTAB, no wheeze, rhonchi or rales.  Cardiovascular: Normal heart sounds, no r/m/g, 2+ pulses in all extremities. No LE edema Abdomen: No TTP, normal bowel sounds MSK: No asymmetry or muscle atrophy.  Skin: no lesions noted on exposed skin Neuro: Alert and oriented x4. CN grossly intact Psych: Normal mood and normal affect   Assessment & Plan:   No problem-specific Assessment & Plan notes found for this encounter.   See Encounters Tab for problem based charting.  Patient Discussed with Dr. {NAMES:[id number]::"Guilloud","Hoffman","Mullen","Narendra","Vincent","Machen","Lau","Hatcher","Williams"} Jeremy Schuller, MD Tillie Rung. [facility]  Residency, PGY-2   HTN On Hyzaar 50-12.5 mg qd. BMP this visit.   HTN ASCVD risk at 21 %. Started on lipitor 20 mg qd. Will recheck this visit.

## 2023-01-15 ENCOUNTER — Encounter: Payer: Medicaid Other | Admitting: Internal Medicine

## 2023-01-23 ENCOUNTER — Ambulatory Visit: Payer: Medicaid Other | Attending: Physical Medicine and Rehabilitation | Admitting: Physical Therapy

## 2023-01-23 ENCOUNTER — Telehealth: Payer: Self-pay | Admitting: Physical Therapy

## 2023-01-23 NOTE — Telephone Encounter (Signed)
Attempted to call pt due to no show PT appt today. Unable to reach pt and unable to leave voicemail.  Janann August, PT, DPT 01/23/23 11:16 AM    [facility] [address] [place], [place]  [address] Phone:  [phone] Fax:  [phone]

## 2023-01-30 ENCOUNTER — Telehealth: Payer: Self-pay | Admitting: Physical Therapy

## 2023-01-30 ENCOUNTER — Encounter: Payer: Self-pay | Admitting: Physical Therapy

## 2023-01-30 ENCOUNTER — Ambulatory Visit: Payer: Medicaid Other | Admitting: Physical Therapy

## 2023-01-30 NOTE — Therapy (Signed)
[facility] [address] [place], [place], [address] Phone: [phone]   Fax:  [phone]  Patient Details-DISCHARGE SUMMARY  Name: Jeremy Smith MRN: [id number] Date of Birth: Mar 23, 1966 Referring Provider:  No ref. provider found  Encounter Date: 01/30/2023  Pt not seen following evaluation and is being discharged due to no show/cancellation policy.  Bary Richard, PT, DPT 01/30/2023, 12:48 PM  [facility] [address] [place], [place], [address] Phone: [phone]   Fax:  [phone]

## 2023-01-30 NOTE — Telephone Encounter (Signed)
PT attempted to reach patient at preferred number in chart following no show for 1145 appt today, the number could not be completed as dialed.  Pt to be discharged from physical therapy due to no show/cancellation policy and will need a new referral to return.  Will cancel remaining PT appts.  Elease Etienne, PT, DPT

## 2023-02-06 ENCOUNTER — Ambulatory Visit: Payer: Medicaid Other | Admitting: Physical Therapy

## 2023-02-13 ENCOUNTER — Ambulatory Visit: Payer: Medicaid Other | Admitting: Physical Therapy

## 2023-02-20 ENCOUNTER — Ambulatory Visit: Payer: Medicaid Other | Admitting: Physical Therapy

## 2023-02-27 ENCOUNTER — Ambulatory Visit: Payer: Medicaid Other | Admitting: Physical Therapy

## 2023-03-04 ENCOUNTER — Encounter
Payer: Medicaid Other | Attending: Physical Medicine and Rehabilitation | Admitting: Physical Medicine and Rehabilitation

## 2023-03-04 DIAGNOSIS — Z7409 Other reduced mobility: Secondary | ICD-10-CM | POA: Insufficient documentation

## 2023-03-04 DIAGNOSIS — R29898 Other symptoms and signs involving the musculoskeletal system: Secondary | ICD-10-CM | POA: Insufficient documentation

## 2023-03-04 DIAGNOSIS — Z789 Other specified health status: Secondary | ICD-10-CM | POA: Insufficient documentation

## 2023-03-04 DIAGNOSIS — M5442 Lumbago with sciatica, left side: Secondary | ICD-10-CM | POA: Insufficient documentation

## 2023-03-04 DIAGNOSIS — G8929 Other chronic pain: Secondary | ICD-10-CM | POA: Insufficient documentation

## 2023-03-04 DIAGNOSIS — G894 Chronic pain syndrome: Secondary | ICD-10-CM | POA: Insufficient documentation

## 2023-03-06 ENCOUNTER — Ambulatory Visit: Payer: Medicaid Other | Admitting: Physical Therapy

## 2023-03-07 ENCOUNTER — Encounter: Payer: Medicaid Other | Admitting: Student

## 2023-03-13 ENCOUNTER — Ambulatory Visit: Payer: Medicaid Other | Admitting: Physical Therapy

## 2023-09-16 ENCOUNTER — Telehealth: Payer: Self-pay | Admitting: *Deleted

## 2023-09-16 ENCOUNTER — Encounter: Payer: Medicaid Other | Admitting: Student

## 2023-09-16 ENCOUNTER — Emergency Department (HOSPITAL_COMMUNITY)
Admission: EM | Admit: 2023-09-16 | Discharge: 2023-09-16 | Disposition: A | Discharge status: Home / Self Care | Payer: Medicaid Other | Attending: Emergency Medicine | Admitting: Emergency Medicine

## 2023-09-16 ENCOUNTER — Other Ambulatory Visit: Payer: Self-pay

## 2023-09-16 ENCOUNTER — Encounter (HOSPITAL_COMMUNITY): Payer: Self-pay

## 2023-09-16 DIAGNOSIS — I10 Essential (primary) hypertension: Secondary | ICD-10-CM | POA: Diagnosis not present

## 2023-09-16 DIAGNOSIS — L255 Unspecified contact dermatitis due to plants, except food: Secondary | ICD-10-CM | POA: Diagnosis not present

## 2023-09-16 DIAGNOSIS — R609 Edema, unspecified: Secondary | ICD-10-CM | POA: Diagnosis not present

## 2023-09-16 DIAGNOSIS — L03211 Cellulitis of face: Secondary | ICD-10-CM | POA: Insufficient documentation

## 2023-09-16 DIAGNOSIS — L237 Allergic contact dermatitis due to plants, except food: Secondary | ICD-10-CM | POA: Diagnosis not present

## 2023-09-16 DIAGNOSIS — L299 Pruritus, unspecified: Secondary | ICD-10-CM | POA: Diagnosis not present

## 2023-09-16 MED ORDER — PREDNISONE 10 MG (21) PO TBPK: ORAL_TABLET | ORAL | 0 refills | Status: DC

## 2023-09-16 MED ORDER — LOSARTAN POTASSIUM 25 MG PO TABS
50.0000 mg | ORAL_TABLET | Freq: Once | ORAL | Status: AC
  Administered 2023-09-16: 50 mg via ORAL
  Filled 2023-09-16: qty 2

## 2023-09-16 MED ORDER — CEPHALEXIN 500 MG PO CAPS
500.0000 mg | ORAL_CAPSULE | Freq: Once | ORAL | Status: AC
  Administered 2023-09-16: 500 mg via ORAL
  Filled 2023-09-16: qty 1

## 2023-09-16 MED ORDER — CEPHALEXIN 500 MG PO CAPS: 500.0000 mg | ORAL_CAPSULE | Freq: Four times a day (QID) | ORAL | 0 refills | Status: AC

## 2023-09-16 MED ORDER — METHYLPREDNISOLONE SODIUM SUCC 125 MG IJ SOLR
125.0000 mg | Freq: Once | INTRAMUSCULAR | Status: AC
  Administered 2023-09-16: 125 mg via INTRAVENOUS
  Filled 2023-09-16: qty 2

## 2023-09-16 MED ORDER — LOSARTAN POTASSIUM-HCTZ 50-12.5 MG PO TABS: 1.0000 | ORAL_TABLET | Freq: Every day | ORAL | 0 refills | Status: DC

## 2023-09-16 MED ORDER — ONDANSETRON HCL 4 MG/2ML IJ SOLN
4.0000 mg | Freq: Once | INTRAMUSCULAR | Status: AC
  Administered 2023-09-16: 4 mg via INTRAVENOUS
  Filled 2023-09-16: qty 2

## 2023-09-16 MED ORDER — HYDROCHLOROTHIAZIDE 12.5 MG PO TABS
12.5000 mg | ORAL_TABLET | Freq: Once | ORAL | Status: AC
  Administered 2023-09-16: 12.5 mg via ORAL
  Filled 2023-09-16: qty 1

## 2023-09-16 MED ORDER — DIPHENHYDRAMINE HCL 50 MG/ML IJ SOLN
25.0000 mg | Freq: Once | INTRAMUSCULAR | Status: AC
  Administered 2023-09-16: 25 mg via INTRAVENOUS
  Filled 2023-09-16: qty 1

## 2023-09-16 MED ORDER — FAMOTIDINE IN NACL 20-0.9 MG/50ML-% IV SOLN
20.0000 mg | Freq: Once | INTRAVENOUS | Status: AC
  Administered 2023-09-16: 20 mg via INTRAVENOUS
  Filled 2023-09-16: qty 50

## 2023-09-16 NOTE — ED Triage Notes (Signed)
Pt arrived via EMS from home for poison ivy. 2 days ago rash. Started on forearm, to chest and face. Left eye swollen. denies n/v/d, no SOB.  162 176/104 BP 78 HR 96% Hx of hypertension.

## 2023-09-16 NOTE — Telephone Encounter (Signed)
Received a call from pt who stated transportation did not pick him up for his 1545 PM appt today. Stated he's going to call the ambulance to bring him to the ER for facial swelling/poison ivy.

## 2023-09-16 NOTE — Telephone Encounter (Signed)
Received a call from pt who stated he has poison ivy from yesterday helping a friend with a tree. It started on hands and arms. And today his face is swollen. Denies any difficulty breathing. He has not tried any OTC meds. Advised pt to take Benadryl. He has an appt already schedule for today @ 1545PM with Dr Nooruddin. Informed pt to keep his appt but if symptoms worsen to go to the ER - stated he understands.

## 2023-09-16 NOTE — ED Provider Notes (Signed)
[place] EMERGENCY DEPARTMENT AT [facility] Provider Note   CSN: [id number] Arrival date & time: 09/16/23  1610     History  Chief Complaint  Patient presents with   Poison Ivy    Jeremy Smith is a 57 y.o. male with a history of hypertension and hyperlipidemia who presents to the ED today for poison ivy. Patient was helping a friend take down a tree 2 days ago and states he was using his arms to wipe the sweat off his face. Since then, he has developed a rash on the inner aspect of his forearms and forehead. He has had increased eyelid swelling as well. His left eye is almost swollen shut at this point. He took 2 Benadryl last night with no relief. No eye pain, vision changes, shortness of breath, chest pain, or throat swelling. No additional complaints or concerns at this time.    Home Medications Prior to Admission medications   Medication Sig Start Date End Date Taking? Authorizing Provider  cephALEXin (KEFLEX) 500 MG capsule Take 1 capsule (500 mg total) by mouth 4 (four) times daily for 5 days. 09/16/23 09/21/23 Yes Maxwell Marion, PA-C  losartan-hydrochlorothiazide (HYZAAR) 50-12.5 MG tablet Take 1 tablet by mouth daily. 09/16/23 10/16/23 Yes Maxwell Marion, PA-C  predniSONE (STERAPRED UNI-PAK 21 TAB) 10 MG (21) TBPK tablet Take 6 tabs by mouth daily for 2 days, then 5 tabs for 2 days, then 4 tabs for 2 days, then 3 tabs for 2 days, 2 tabs for 2 days, then 1 tab by mouth daily for 2 days 09/16/23  Yes Maxwell Marion, PA-C  atorvastatin (LIPITOR) 20 MG tablet Take 1 tablet (20 mg total) by mouth daily. 11/30/22 11/30/23  Marrianne Mood, MD  diphenhydramine-acetaminophen (TYLENOL PM) 25-500 MG TABS Take 1-2 tablets by mouth at bedtime as needed (sleep).    [provider]  gabapentin (NEURONTIN) 300 MG capsule Take 1 capsule (300 mg total) by mouth 3 (three) times daily. 01/02/23   Angelina Sheriff, DO  ibuprofen (ADVIL,MOTRIN) 800 MG tablet Take 1 tablet (800 mg  total) by mouth 3 (three) times daily. 03/01/14   Schinlever, Santina Evans, PA-C  losartan-hydrochlorothiazide (HYZAAR) 50-12.5 MG tablet Take 1 tablet by mouth daily. 11/30/22   Marrianne Mood, MD      Allergies    Bee venom    Review of Systems   Review of Systems  Skin:  Positive for rash.  All other systems reviewed and are negative.      Physical Exam Updated Vital Signs BP (!) 181/113 (BP Location: Left Arm)   Pulse 70   Temp 99.2 F (37.3 C) (Oral)   Resp 18   Ht 6' (1.829 m)   Wt 95.3 kg   SpO2 98%   BMI 28.48 kg/m  Physical Exam Vitals and nursing note reviewed.  Constitutional:      General: He is not in acute distress.    Appearance: Normal appearance.  HENT:     Head: Normocephalic and atraumatic.     Mouth/Throat:     Mouth: Mucous membranes are moist.  Eyes:     Conjunctiva/sclera: Conjunctivae normal.     Pupils: Pupils are equal, round, and reactive to light.     Comments: Upper and lower eyelids of left eye are swollen as well as right lower eyelid swelling  Cardiovascular:     Rate and Rhythm: Normal rate and regular rhythm.     Pulses: Normal pulses.     Heart sounds: Normal  heart sounds.  Pulmonary:     Effort: Pulmonary effort is normal.     Breath sounds: Normal breath sounds.  Abdominal:     Palpations: Abdomen is soft.     Tenderness: There is no abdominal tenderness.  Skin:    General: Skin is warm and dry.     Findings: Rash present.     Comments: Papules on an erythematous base present on volar aspect of forearms bilaterally and across forehead.  Neurological:     General: No focal deficit present.     Mental Status: He is alert.  Psychiatric:        Mood and Affect: Mood normal.        Behavior: Behavior normal.    ED Results / Procedures / Treatments   Labs (all labs ordered are listed, but only abnormal results are displayed) Labs Reviewed - No data to display  EKG None  Radiology No results  found.  Procedures Procedures: not indicated.   Medications Ordered in ED Medications  cephALEXin (KEFLEX) capsule 500 mg (has no administration in time range)  methylPREDNISolone sodium succinate (SOLU-MEDROL) 125 mg/2 mL injection 125 mg (125 mg Intravenous Given 09/16/23 1735)  diphenhydrAMINE (BENADRYL) injection 25 mg (25 mg Intravenous Given 09/16/23 1736)  ondansetron (ZOFRAN) injection 4 mg (4 mg Intravenous Given 09/16/23 1734)  famotidine (PEPCID) IVPB 20 mg premix (0 mg Intravenous Stopped 09/16/23 1908)  losartan (COZAAR) tablet 50 mg (50 mg Oral Given 09/16/23 1756)  hydrochlorothiazide (HYDRODIURIL) tablet 12.5 mg (12.5 mg Oral Given 09/16/23 1756)    ED Course/ Medical Decision Making/ A&P                                 Medical Decision Making Risk Prescription drug management.   This patient presents to the ED for concern of poison ivy, this involves an extensive number of treatment options, and is a complaint that carries with it a high risk of complications and morbidity.   Differential diagnosis includes: contact dermatitis, allergic dermatitis, cellulitis, MRSA, etc.   Comorbidities  See HPI above   Additional History  Additional history obtained from previous records.   Problem List / ED Course / Critical Interventions / Medication Management  Poison ivy exposure I ordered medications including: Benadryl, Pepcid, Zofran, and Solumedrol for allergic reaction Losartan and hydrochlorothiazide for elevated blood pressure  Reevaluation of the patient after these medicines showed that the patient improved I have reviewed the patients home medicines and have made adjustments as needed Wrapped bilateral forearms prior to discharge since the blisters are weeping.   Social Determinants of Health  Physical activity   Test / Admission - Considered  Discussed findings with patient.  All questions were answered. He is stable and safe for discharge  home. Return precautions provided.       Final Clinical Impression(s) / ED Diagnoses Final diagnoses:  Poison ivy dermatitis  Cellulitis of face    Rx / DC Orders ED Discharge Orders          Ordered    predniSONE (STERAPRED UNI-PAK 21 TAB) 10 MG (21) TBPK tablet        09/16/23 1905    cephALEXin (KEFLEX) 500 MG capsule  4 times daily        09/16/23 1910    losartan-hydrochlorothiazide (HYZAAR) 50-12.5 MG tablet  Daily        09/16/23 1913  Maxwell Marion, PA-C 09/16/23 1916    Gloris Manchester, MD 09/17/23 0030

## 2023-09-16 NOTE — Discharge Instructions (Addendum)
As discussed, take the steroid pack as directed to help with the poison ivy. Additionally, you can take Benadryl and calamine lotion to help with itchiness.  Take the Keflex 4x a day for 5 days to help with the cellulitis (skin infection/swelling).   I have sent a refill of your BP medication to the pharmacy as well.  Follow-up with the PCP as scheduled.  Get help right away if: You notice red streaks coming from the infected area. You notice the skin turns purple or black and falls off.

## 2023-10-01 ENCOUNTER — Other Ambulatory Visit: Payer: Self-pay

## 2023-10-01 ENCOUNTER — Encounter: Payer: Self-pay | Admitting: Student

## 2023-10-01 ENCOUNTER — Ambulatory Visit: Payer: Medicaid Other | Admitting: Student

## 2023-10-01 VITALS — BP 157/82 | HR 63 | Temp 98.0°F | Ht 72.0 in | Wt 207.1 lb

## 2023-10-01 DIAGNOSIS — H538 Other visual disturbances: Secondary | ICD-10-CM | POA: Diagnosis not present

## 2023-10-01 DIAGNOSIS — E785 Hyperlipidemia, unspecified: Secondary | ICD-10-CM

## 2023-10-01 DIAGNOSIS — I10 Essential (primary) hypertension: Secondary | ICD-10-CM

## 2023-10-01 DIAGNOSIS — M545 Low back pain, unspecified: Secondary | ICD-10-CM

## 2023-10-01 DIAGNOSIS — H547 Unspecified visual loss: Secondary | ICD-10-CM

## 2023-10-01 MED ORDER — AMLODIPINE BESYLATE 5 MG PO TABS: 5.0000 mg | ORAL_TABLET | Freq: Every day | ORAL | 3 refills | Status: DC

## 2023-10-01 MED ORDER — ATORVASTATIN CALCIUM 20 MG PO TABS: 20.0000 mg | ORAL_TABLET | Freq: Every day | ORAL | 11 refills | Status: DC

## 2023-10-01 MED ORDER — GABAPENTIN 300 MG PO CAPS: 300.0000 mg | ORAL_CAPSULE | Freq: Three times a day (TID) | ORAL | 5 refills | Status: DC

## 2023-10-01 MED ORDER — LOSARTAN POTASSIUM-HCTZ 50-12.5 MG PO TABS: 1.0000 | ORAL_TABLET | Freq: Every day | ORAL | 2 refills | Status: DC

## 2023-10-01 NOTE — Assessment & Plan Note (Signed)
Previously started on Losartan-hydrochlorothiazide in January 2024. Does not check his blood pressure at home. In clinic, blood pressure elevated at 165/82 and 157/82 on recheck.  Will add on amlodipine 5mg  at this time.   Plan:  - Continue losartan-hydrochlorothiazide 50-12.5mg   - Start amlodipine 5mg 

## 2023-10-01 NOTE — Assessment & Plan Note (Signed)
Last Lipid Panel in January showed an LDL of 133, with a 10 year ASCVD risk of 20.8%. He was subsequently started on Atorvastatin 20mg , which he had been taking consistently up until about two months ago. Will refill today, and plan to check Lipid Panel in about one-two months.

## 2023-10-01 NOTE — Assessment & Plan Note (Signed)
Pt previously referred to opthalmology, however missed appointment and referral has been closed. He states he has trouble reading things close to his face. Will place new referral.

## 2023-10-01 NOTE — Progress Notes (Signed)
CC: Blood pressure follow up  HPI:  [name] is a 57 y.o. male living with a history stated below and presents today for blood pressure follow up. Please see problem based assessment and plan for additional details.  Past Medical History:  Diagnosis Date   Chronic pain    High cholesterol    Hypertension    Nephrolithiasis     Current Outpatient Medications on File Prior to Visit  Medication Sig Dispense Refill   diphenhydramine-acetaminophen (TYLENOL PM) 25-500 MG TABS Take 1-2 tablets by mouth at bedtime as needed (sleep).     ibuprofen (ADVIL,MOTRIN) 800 MG tablet Take 1 tablet (800 mg total) by mouth 3 (three) times daily. 12 tablet 0   predniSONE (STERAPRED UNI-PAK 21 TAB) 10 MG (21) TBPK tablet Take 6 tabs by mouth daily for 2 days, then 5 tabs for 2 days, then 4 tabs for 2 days, then 3 tabs for 2 days, 2 tabs for 2 days, then 1 tab by mouth daily for 2 days 42 tablet 0   No current facility-administered medications on file prior to visit.    Family History  Adopted: Yes  Family history unknown: Yes    Social History   Socioeconomic History   Marital status: Single    Spouse name: Not on file   Number of children: Not on file   Years of education: Not on file   Highest education level: Not on file  Occupational History   Not on file  Tobacco Use   Smoking status: Every Day    Current packs/day: 0.50    Types: Cigarettes   Smokeless tobacco: Never  Vaping Use   Vaping status: Never Used  Substance and Sexual Activity   Alcohol use: Yes    Comment: occ   Drug use: Yes    Types: Marijuana   Sexual activity: Not on file  Other Topics Concern   Not on file  Social History Narrative   Lives with wife in [place]. Spends days watching television, enjoys playing video games like [place] [address]. [place] of [place] sports teams as this patient is from [place] originally. Drinks a six-pack per week. Smokes marijuana occasionally. Remote history of cocaine use  but none recently. No illicit opioids, prescribed or otherwise. Smokes 3 cigarettes per day, currently trying to quit, smoked 1.5 ppd as recently as 4 years ago. Started smoking at 17.   Social Determinants of Health   Financial Resource Strain: Not on file  Food Insecurity: No Food Insecurity (11/29/2022)   Hunger Vital Sign    Worried About Running Out of Food in the Last Year: Never true    Ran Out of Food in the Last Year: Never true  Transportation Needs: No Transportation Needs (11/29/2022)   PRAPARE - Administrator, Civil Service (Medical): No    Lack of Transportation (Non-Medical): No  Physical Activity: Not on file  Stress: Not on file  Social Connections: Moderately Isolated (11/29/2022)   Social Connection and Isolation Panel [NHANES]    Frequency of Communication with Friends and Family: More than three times a week    Frequency of Social Gatherings with Friends and Family: More than three times a week    Attends Religious Services: Never    Database administrator or Organizations: No    Attends Banker Meetings: Never    Marital Status: Married  Catering manager Violence: Not At Risk (11/29/2022)   Humiliation, Afraid, Rape, and Kick questionnaire  Fear of Current or Ex-Partner: No    Emotionally Abused: No    Physically Abused: No    Sexually Abused: No    Review of Systems: ROS negative except for what is noted on the assessment and plan.  Vitals:   10/01/23 0901 10/01/23 0906  BP: (!) 165/82 (!) 157/82  Pulse: 65 63  Temp: 98 F (36.7 C)   TempSrc: Oral   SpO2: 100%   Weight: 207 lb 1.6 oz (93.9 kg)   Height: 6' (1.829 m)     Physical Exam: Constitutional: well-appearing male  in no acute distress Cardiovascular: regular rate and rhythm, no m/r/g Pulmonary/Chest: normal work of breathing on room air, lungs clear to auscultation bilaterally MSK: normal bulk and tone, mild tenderness to palpation in lower back, no paraspinal  tenderness Neurological: alert & oriented x 3, 5/5 strength in bilateral upper and lower extremities, normal gait   Assessment & Plan:   Hypertension Previously started on Losartan-hydrochlorothiazide in January 2024. Does not check his blood pressure at home. In clinic, blood pressure elevated at 165/82 and 157/82 on recheck.  Will add on amlodipine 5mg  at this time.   Plan:  - Continue losartan-hydrochlorothiazide 50-12.5mg   - Start amlodipine 5mg   Back pain Ruptured disc in 2008, and has been having lower back pain ever since then. He was sent to a physiatry for evaluation and given Gabapentin however he was not able to get it and has not been taking it. He describes the pain as non-radiating and sharp in his L5 area. No paraspinal tenderness, no red flag symptoms either.   Will refill Gabapentin, however instructed pt to return to physiatry for further evaluation.   Blurry vision Pt previously referred to opthalmology, however missed appointment and referral has been closed. He states he has trouble reading things close to his face. Will place new referral.   Hyperlipidemia Last Lipid Panel in January showed an LDL of 133, with a 10 year ASCVD risk of 20.8%. He was subsequently started on Atorvastatin 20mg , which he had been taking consistently up until about two months ago. Will refill today, and plan to check Lipid Panel in about one-two months.   Patient discussed with Dr. Lanelle Bal Eirene Rather, M.D. [facility] Internal Medicine, PGY-2 Pager: [phone] Date 10/01/2023 Time 11:08 AM

## 2023-10-01 NOTE — Assessment & Plan Note (Signed)
Ruptured disc in 2008, and has been having lower back pain ever since then. He was sent to a physiatry for evaluation and given Gabapentin however he was not able to get it and has not been taking it. He describes the pain as non-radiating and sharp in his L5 area. No paraspinal tenderness, no red flag symptoms either.   Will refill Gabapentin, however instructed pt to return to physiatry for further evaluation.

## 2023-10-01 NOTE — Patient Instructions (Addendum)
Thank you so much for coming to the clinic today!   I have refilled all of your medication. For your blood pressure, we are adding on a new medication called amlodipine. I have sent in the cholesterol medication as well, and the gabapentin.   The number for the eye doctor is: [phone], I have also placed another referral.    If you have any questions please feel free to the call the clinic at anytime at [phone]. It was a pleasure seeing you!  Best, Dr. Thomasene Ripple

## 2023-10-03 NOTE — Progress Notes (Signed)
Internal Medicine Clinic Attending  Case discussed with the resident at the time of the visit.  We reviewed the resident's history and exam and pertinent patient test results.  I agree with the assessment, diagnosis, and plan of care documented in the resident's note.  Debe Coder, MD

## 2023-10-31 ENCOUNTER — Ambulatory Visit: Payer: Medicaid Other | Admitting: Student

## 2023-10-31 ENCOUNTER — Encounter: Payer: Self-pay | Admitting: Student

## 2023-10-31 VITALS — BP 145/87 | HR 65 | Temp 98.2°F | Ht 72.0 in | Wt 202.0 lb

## 2023-10-31 DIAGNOSIS — G894 Chronic pain syndrome: Secondary | ICD-10-CM

## 2023-10-31 DIAGNOSIS — L237 Allergic contact dermatitis due to plants, except food: Secondary | ICD-10-CM | POA: Diagnosis not present

## 2023-10-31 DIAGNOSIS — F17210 Nicotine dependence, cigarettes, uncomplicated: Secondary | ICD-10-CM | POA: Diagnosis not present

## 2023-10-31 DIAGNOSIS — E785 Hyperlipidemia, unspecified: Secondary | ICD-10-CM

## 2023-10-31 DIAGNOSIS — F17200 Nicotine dependence, unspecified, uncomplicated: Secondary | ICD-10-CM | POA: Insufficient documentation

## 2023-10-31 DIAGNOSIS — I10 Essential (primary) hypertension: Secondary | ICD-10-CM | POA: Diagnosis not present

## 2023-10-31 DIAGNOSIS — R7309 Other abnormal glucose: Secondary | ICD-10-CM

## 2023-10-31 MED ORDER — NICOTINE 7 MG/24HR TD PT24: 7.0000 mg | MEDICATED_PATCH | TRANSDERMAL | 1 refills | Status: AC

## 2023-10-31 MED ORDER — VARENICLINE TARTRATE 1 MG PO TABS: ORAL_TABLET | ORAL | 0 refills | Status: DC

## 2023-10-31 MED ORDER — TRIAMCINOLONE ACETONIDE 0.1 % EX OINT: TOPICAL_OINTMENT | Freq: Two times a day (BID) | CUTANEOUS | 0 refills | Status: AC | PRN

## 2023-10-31 MED ORDER — AMLODIPINE BESYLATE 10 MG PO TABS: 10.0000 mg | ORAL_TABLET | Freq: Every day | ORAL | 3 refills | Status: DC

## 2023-10-31 MED ORDER — GABAPENTIN 600 MG PO TABS: 600.0000 mg | ORAL_TABLET | Freq: Three times a day (TID) | ORAL | 5 refills | Status: DC

## 2023-10-31 NOTE — Assessment & Plan Note (Signed)
He smokes about 4 cigarettes a day, has a past history of 1 pack a day for many years.  He is slowly reduced over time but is having trouble quitting altogether.  Would like medicine to help him. - Will initiate Chantix, 12-week course, as well as 8 weeks of nicotine patch.  Quit goal is 4 weeks from now: the new year.

## 2023-10-31 NOTE — Assessment & Plan Note (Signed)
Most recent lipid panel was in January 2024, was elevated, subsequently started on atorvastatin 20.  Will recheck lipids today.

## 2023-10-31 NOTE — Progress Notes (Signed)
   CC: Follow up HTN. Poison ivy.  HPI:  [name] is a 57 y.o. male living with a history stated below and presents today for follow up. Please see problem based assessment and plan for additional details.  Past Medical History:  Diagnosis Date   Chronic pain    High cholesterol    Hypertension    Nephrolithiasis     Review of Systems: ROS negative except for what is noted on the assessment and plan.  Vitals:   10/31/23 1308 10/31/23 1347  BP: (!) 143/85 (!) 145/87  Pulse: 69 65  Temp: 98.2 F (36.8 C)   TempSrc: Oral   Weight: 202 lb (91.6 kg)   Height:  6' (1.829 m)    Physical Exam: Constitutional: well-appearing man, in no acute distress HENT: normocephalic atraumatic, mucous membranes moist Eyes: conjunctiva non-erythematous Cardiovascular: regular rate and rhythm, no m/r/g Pulmonary/Chest: normal work of breathing on room air, lungs clear to auscultation bilaterally MSK: normal bulk and tone Neurological: alert & oriented x 3, no focal deficit Skin: warm and dry Psych: normal mood and behavior  Assessment & Plan:   Patient seen with Dr. Sol Blazing  Hypertension Blood pressure 145/87 confirmed on repeat, not at goal.  Amlodipine 5 was started 1 month ago.  I will increase this medicine. - Increase amlodipine to 10 mg daily and continue losartan-hydrochlorothiazide 50-12.5 daily - BMP today  Chronic pain syndrome He has sciatic pain that is being treated with gabapentin.  Current regimen 300 three times daily, but he does not feel that he has adequate control.  He established with a physiatrist earlier this year, but has not been back.  He had a physical therapy but felt that the first session was too painful and so did not return.  I have encouraged him to return to his physiatrist, for now I will increase gabapentin. - Increase gabapentin to 600 three times daily.  Smoking He smokes about 4 cigarettes a day, has a past history of 1 pack a day for many  years.  He is slowly reduced over time but is having trouble quitting altogether.  Would like medicine to help him. - Will initiate Chantix, 12-week course, as well as 8 weeks of nicotine patch.  Quit goal is 4 weeks from now: the new year.  Poison ivy dermatitis Small area of erythema, mild swelling, mild itchiness that he attributes to poison ivy from working in the yard.  He has tried calamine lotion with some help. - Triamcinolone 0.1% ointment, use no more than 2 weeks.  Elevated hemoglobin A1c Last A1c a year ago was 5.9, prediabetic, will check.  Hyperlipidemia Most recent lipid panel was in January 2024, was elevated, subsequently started on atorvastatin 20.  Will recheck lipids today.  Katheran James, D.O. [facility] Internal Medicine, PGY-1 Phone: [phone] Date 10/31/2023 Time 8:56 PM

## 2023-10-31 NOTE — Assessment & Plan Note (Signed)
He has sciatic pain that is being treated with gabapentin.  Current regimen 300 three times daily, but he does not feel that he has adequate control.  He established with a physiatrist earlier this year, but has not been back.  He had a physical therapy but felt that the first session was too painful and so did not return.  I have encouraged him to return to his physiatrist, for now I will increase gabapentin. - Increase gabapentin to 600 three times daily.

## 2023-10-31 NOTE — Patient Instructions (Signed)
For blood pressure, increase amlodipine to 10 mg daily.  While you still have your 5 mg pills, you can simply take 2 of those per day.  At your next refill, you will get 10 mg tablets and only take 1 of those.  Poison ivy, I will give triamcinolone ointment.  Apply this to the itchy areas once or twice daily as needed.  Do not use for more than 2 weeks straight.  For smoking, I will give Chantix and nicotine patches for the Chantix, you will take one half a pill for the first 3 days, then 1 pill daily for days 4 through 7, and then 2 pills daily for total of 12 weeks.  I will dispense enough pills for the full 12-week course.  I will also give you nicotine patches, use 1 of these every day for the first 60 days.  After that you will not use them anymore.  Aim to quit smoking between 2 and 4 weeks from now, which would be sometime around the end of this year.  For pain, I will increase your dose of gabapentin.  I would suggest that you meet with your physiologist /pain doctor.  Please return to the clinic in 3 months.

## 2023-10-31 NOTE — Assessment & Plan Note (Addendum)
Blood pressure 145/87 confirmed on repeat, not at goal.  Amlodipine 5 was started 1 month ago.  I will increase this medicine. - Increase amlodipine to 10 mg daily and continue losartan-hydrochlorothiazide 50-12.5 daily - BMP today

## 2023-10-31 NOTE — Assessment & Plan Note (Signed)
Last A1c a year ago was 5.9, prediabetic, will check.

## 2023-10-31 NOTE — Assessment & Plan Note (Signed)
Small area of erythema, mild swelling, mild itchiness that he attributes to poison ivy from working in the yard.  He has tried calamine lotion with some help. - Triamcinolone 0.1% ointment, use no more than 2 weeks.

## 2023-11-01 LAB — LIPID PANEL

## 2023-11-02 LAB — BMP8+ANION GAP
Anion Gap: 16 mmol/L (ref 10.0–18.0)
BUN/Creatinine Ratio: 19 (ref 9–20)
BUN: 16 mg/dL (ref 6–24)
CO2: 21 mmol/L (ref 20–29)
Calcium: 9.6 mg/dL (ref 8.7–10.2)
Chloride: 102 mmol/L (ref 96–106)
Creatinine, Ser: 0.85 mg/dL (ref 0.76–1.27)
Glucose: 101 mg/dL — ABNORMAL HIGH (ref 70–99)
Potassium: 4 mmol/L (ref 3.5–5.2)
Sodium: 139 mmol/L (ref 134–144)
eGFR: 102 mL/min/{1.73_m2} (ref >59)

## 2023-11-02 LAB — LIPID PANEL
Cholesterol, Total: 122 mg/dL (ref 100–199)
HDL: 36 mg/dL — ABNORMAL LOW (ref >39)
LDL CALC COMMENT:: 3.4 ratio (ref 0.0–5.0)
LDL Chol Calc (NIH): 67 mg/dL (ref 0–99)
Triglycerides: 103 mg/dL (ref 0–149)
VLDL Cholesterol Cal: 19 mg/dL (ref 5–40)

## 2023-11-02 LAB — HEMOGLOBIN A1C
Est. average glucose Bld gHb Est-mCnc: 123 mg/dL
Hgb A1c MFr Bld: 5.9 % — ABNORMAL HIGH (ref 4.8–5.6)

## 2023-11-04 NOTE — Progress Notes (Signed)
Internal Medicine Clinic Attending  I saw and evaluated the patient.  I personally confirmed the key portions of the history and exam documented by Dr.  Ninfa Meeker  and I reviewed pertinent patient test results.  The assessment, diagnosis, and plan were formulated together and I agree with the documentation in the resident's note. 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 Limited course of topical steroids, avoid contact with the eyes. Return precautions given.

## 2023-11-06 ENCOUNTER — Telehealth: Payer: Self-pay

## 2023-11-06 NOTE — Telephone Encounter (Signed)
Informed pt he could buy a machine at a [facility] or the doctor can write a rx to take to a medical supply store. Stated he would like for his insurance co pay for one.

## 2023-11-06 NOTE — Telephone Encounter (Signed)
Requesting bp monitor, please call pt back.

## 2023-11-07 LAB — HM DIABETES EYE EXAM

## 2023-11-07 NOTE — Telephone Encounter (Signed)
Stated he will got a [facility] to purchase a BP monitor.

## 2023-11-15 ENCOUNTER — Telehealth: Payer: Self-pay

## 2023-11-15 NOTE — Telephone Encounter (Signed)
Patient called he is requesting a referral for pain management, patient stated during his last visit him and the doctor discussed that he should continue going to pain management.

## 2023-12-05 ENCOUNTER — Telehealth: Payer: Self-pay | Admitting: Student

## 2023-12-05 NOTE — Telephone Encounter (Signed)
This pt was seen by Dr. Ninfa Meeker on 10/31/2023 and is requesting to see a Pain Specialist.  Pt states he was told at his visit on 10/31/2023 that he would get a new referral.  Can a New referral be placed or does this pt need another appointment with [facility]?

## 2023-12-06 NOTE — Addendum Note (Signed)
Addended by: Katheran James on: 12/06/2023 02:14 PM   Modules accepted: Orders

## 2023-12-12 ENCOUNTER — Encounter: Payer: Self-pay | Admitting: Student

## 2024-01-07 DIAGNOSIS — M545 Low back pain, unspecified: Secondary | ICD-10-CM | POA: Diagnosis not present

## 2024-01-07 DIAGNOSIS — M5416 Radiculopathy, lumbar region: Secondary | ICD-10-CM | POA: Diagnosis not present

## 2024-01-07 DIAGNOSIS — G8929 Other chronic pain: Secondary | ICD-10-CM | POA: Diagnosis not present

## 2024-01-08 ENCOUNTER — Other Ambulatory Visit: Payer: Self-pay | Admitting: Nurse Practitioner

## 2024-01-08 DIAGNOSIS — M5416 Radiculopathy, lumbar region: Secondary | ICD-10-CM

## 2024-01-19 ENCOUNTER — Other Ambulatory Visit: Payer: Medicaid Other

## 2024-02-02 ENCOUNTER — Ambulatory Visit
Admission: RE | Admit: 2024-02-02 | Discharge: 2024-02-02 | Disposition: A | Discharge status: Home / Self Care | Source: Ambulatory Visit | Attending: Nurse Practitioner | Admitting: Nurse Practitioner

## 2024-02-02 DIAGNOSIS — M47816 Spondylosis without myelopathy or radiculopathy, lumbar region: Secondary | ICD-10-CM | POA: Diagnosis not present

## 2024-02-02 DIAGNOSIS — M5126 Other intervertebral disc displacement, lumbar region: Secondary | ICD-10-CM | POA: Diagnosis not present

## 2024-02-02 DIAGNOSIS — M48061 Spinal stenosis, lumbar region without neurogenic claudication: Secondary | ICD-10-CM | POA: Diagnosis not present

## 2024-02-02 DIAGNOSIS — M5416 Radiculopathy, lumbar region: Secondary | ICD-10-CM

## 2024-02-11 DIAGNOSIS — M545 Low back pain, unspecified: Secondary | ICD-10-CM | POA: Diagnosis not present

## 2024-02-11 DIAGNOSIS — M5416 Radiculopathy, lumbar region: Secondary | ICD-10-CM | POA: Diagnosis not present

## 2024-02-11 DIAGNOSIS — G8929 Other chronic pain: Secondary | ICD-10-CM | POA: Diagnosis not present

## 2024-03-11 DIAGNOSIS — G8929 Other chronic pain: Secondary | ICD-10-CM | POA: Diagnosis not present

## 2024-03-11 DIAGNOSIS — M545 Low back pain, unspecified: Secondary | ICD-10-CM | POA: Diagnosis not present

## 2024-03-11 DIAGNOSIS — M47816 Spondylosis without myelopathy or radiculopathy, lumbar region: Secondary | ICD-10-CM | POA: Diagnosis not present

## 2024-04-08 DIAGNOSIS — G8929 Other chronic pain: Secondary | ICD-10-CM | POA: Diagnosis not present

## 2024-04-08 DIAGNOSIS — M79605 Pain in left leg: Secondary | ICD-10-CM | POA: Diagnosis not present

## 2024-04-08 DIAGNOSIS — M47816 Spondylosis without myelopathy or radiculopathy, lumbar region: Secondary | ICD-10-CM | POA: Diagnosis not present

## 2024-04-08 DIAGNOSIS — M5416 Radiculopathy, lumbar region: Secondary | ICD-10-CM | POA: Diagnosis not present

## 2024-04-08 DIAGNOSIS — M545 Low back pain, unspecified: Secondary | ICD-10-CM | POA: Diagnosis not present

## 2024-07-15 DIAGNOSIS — M79605 Pain in left leg: Secondary | ICD-10-CM | POA: Diagnosis not present

## 2024-07-15 DIAGNOSIS — M545 Low back pain, unspecified: Secondary | ICD-10-CM | POA: Diagnosis not present

## 2024-07-15 DIAGNOSIS — M47816 Spondylosis without myelopathy or radiculopathy, lumbar region: Secondary | ICD-10-CM | POA: Diagnosis not present

## 2024-07-15 DIAGNOSIS — G8929 Other chronic pain: Secondary | ICD-10-CM | POA: Diagnosis not present

## 2024-07-15 DIAGNOSIS — M5416 Radiculopathy, lumbar region: Secondary | ICD-10-CM | POA: Diagnosis not present

## 2024-08-05 DIAGNOSIS — M5416 Radiculopathy, lumbar region: Secondary | ICD-10-CM | POA: Diagnosis not present

## 2024-12-04 ENCOUNTER — Ambulatory Visit: Payer: Self-pay

## 2024-12-04 NOTE — Telephone Encounter (Signed)
 FYI Only or Action Required?: Action required by provider: referral request.mental health-therapy referral request. Appointment scheduled on preferred date of 12/09/24  Patient was last seen in primary care on 10/31/2023 by Harrie Bruckner, DO.  Called Nurse Triage reporting Anxiety and Back Pain.  Symptoms began several weeks ago.  Interventions attempted: Rest, hydration, or home remedies.  Symptoms are: unchanged.  Triage Disposition: See PCP When Office is Open (Within 3 Days)  Patient/caregiver understands and will follow disposition?: Yes, but will wait   Reason for Disposition  [1] Anxiety symptoms AND [2] has not been evaluated for this by doctor (or NP/PA)  Answer Assessment - Initial Assessment Questions Talking with a doctor yesterday about back pain causing him to be unable to work which is causing anxiety due to bills pilling up. He was advised to contact pcp and request referral to mental health-therapy.  Requesting to schedule on Wednesday afternoon-scheduled.   1. CONCERN: Did anything happen that prompted you to call today?      Anxiety  2. ANXIETY SYMPTOMS: Can you describe how you (your loved one; patient) have been feeling? (e.g., tense, restless, panicky, anxious, keyed up, overwhelmed, sense of impending doom).      Anxious, overwhelmed, sad at times 3. ONSET: How long have you been feeling this way? (e.g., hours, days, weeks)     Several weeks 4. SEVERITY: How would you rate the level of anxiety? (e.g., 0 - 10; or mild, moderate, severe).     moderate 5. FUNCTIONAL IMPAIRMENT: How have these feelings affected your ability to do daily activities? Have you had more difficulty than usual doing your normal daily activities? (e.g., getting better, same, worse; self-care, school, work, interactions)     Unable to work due to back pain 6. HISTORY: Have you felt this way before? Have you ever been diagnosed with an anxiety problem in the past?  (e.g., generalized anxiety disorder, panic attacks, PTSD). If Yes, ask: How was this problem treated? (e.g., medicines, counseling, etc.)     no 7. RISK OF HARM - SUICIDAL IDEATION: Do you ever have thoughts of hurting or killing yourself? If Yes, ask:  Do you have these feelings now? Do you have a plan on how you would do this?     no 8. TREATMENT:  What has been done so far to treat this anxiety? (e.g., medicines, relaxation strategies). What has helped?     No treatment at this time, 1st time calling for anxiety 9. THERAPIST: Do you have a counselor or therapist? If Yes, ask: What is their name?     No-requesting referral 10. POTENTIAL TRIGGERS: Do you drink caffeinated beverages (e.g., coffee, colas, teas), and how much daily? Do you drink alcohol or use any drugs? Have you started any new medicines recently?       Back pain preventing him from working 11. PATIENT SUPPORT: Who is with you now? Who do you live with? Do you have family or friends who you can talk to?         66. OTHER SYMPTOMS: Do you have any other symptoms? (e.g., feeling depressed, trouble concentrating, trouble sleeping, trouble breathing, palpitations or fast heartbeat, chest pain, sweating, nausea, or diarrhea)      Chronic Back pain-followed by pain management.  Protocols used: Anxiety and Panic Attack-A-AH  Message from [place] G sent at 12/04/2024 11:41 AM EST  Summary: Mental anxiety   Reason for Triage: Mental anxiety

## 2024-12-09 ENCOUNTER — Ambulatory Visit: Payer: Self-pay

## 2024-12-09 VITALS — BP 177/100 | HR 64 | Temp 98.2°F | Ht 72.0 in | Wt 207.2 lb

## 2024-12-09 DIAGNOSIS — F32A Depression, unspecified: Secondary | ICD-10-CM | POA: Diagnosis not present

## 2024-12-09 DIAGNOSIS — E785 Hyperlipidemia, unspecified: Secondary | ICD-10-CM | POA: Diagnosis not present

## 2024-12-09 DIAGNOSIS — I10 Essential (primary) hypertension: Secondary | ICD-10-CM

## 2024-12-09 DIAGNOSIS — Z0000 Encounter for general adult medical examination without abnormal findings: Secondary | ICD-10-CM | POA: Insufficient documentation

## 2024-12-09 DIAGNOSIS — M545 Low back pain, unspecified: Secondary | ICD-10-CM | POA: Diagnosis present

## 2024-12-09 MED ORDER — GABAPENTIN 600 MG PO TABS: 600.0000 mg | ORAL_TABLET | Freq: Three times a day (TID) | ORAL | 3 refills | Status: AC

## 2024-12-09 MED ORDER — AMLODIPINE BESYLATE 10 MG PO TABS: 10.0000 mg | ORAL_TABLET | Freq: Every day | ORAL | 3 refills | Status: AC

## 2024-12-09 MED ORDER — ATORVASTATIN CALCIUM 20 MG PO TABS: 20.0000 mg | ORAL_TABLET | Freq: Every day | ORAL | 11 refills | Status: AC

## 2024-12-09 MED ORDER — LOSARTAN POTASSIUM-HCTZ 50-12.5 MG PO TABS: 1.0000 | ORAL_TABLET | Freq: Every day | ORAL | 2 refills | Status: DC

## 2024-12-09 MED ORDER — LOSARTAN POTASSIUM-HCTZ 100-25 MG PO TABS: 1.0000 | ORAL_TABLET | Freq: Every day | ORAL | 3 refills | Status: AC

## 2024-12-09 NOTE — Assessment & Plan Note (Signed)
 Patient has been out of his blood pressure medications for about 2 weeks. Patient states that he checks his blood pressures at home and they are usually around SBP 140s-150s. On amlodipine  10 mg and Hyzaar 50-12.5 mg. I think with this Bps still being elevated at home, can titrate up his Hyzaar dose  Plan: Continue amlodipine  10 mg  Increase hyzaar to 100-25 mg daily Continue checking blood pressure at home  BMP at next visit

## 2024-12-09 NOTE — Assessment & Plan Note (Signed)
 Patient needed refill on atorvastatin  20 mg. This medication was refilled

## 2024-12-09 NOTE — Assessment & Plan Note (Signed)
 Patient has been seeing pain management for this. Has had previous back surgery in 2008 at L5-S1 disc patient reported. Patient has epidural steroid injections and facet injections. States that pain management is trying nerve ablations next. 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 Patient reports taking his gabapentin  with no relief. Patient has also been doing physical therapy exercises at home.   Plan:  Referral to neurosurgery for other options Continue working with pain management Continue physical therapy exercises  Continue gabapentin  600mg  TID

## 2024-12-09 NOTE — Assessment & Plan Note (Signed)
 Patient declined flu shot at this time due to his transportation picking him up imminently, but would like to get at his next appointment. Did encourage him that he could walk in for this as well.

## 2024-12-09 NOTE — Assessment & Plan Note (Signed)
 PHQ 9 is 7. Patient reports that he

## 2024-12-09 NOTE — Assessment & Plan Note (Signed)
 Patient reports feeling depressed due to not being able to be as active anymore. He works in holiday [employer] and used to be the breadwinner but unable to support his family at this time as he used to. PHQ-9 showed score of 7. Patient states that he does not like taking medications if not needed, but would like to talk to someone about his feelings.   Plan: Referral to [facility]

## 2024-12-09 NOTE — Progress Notes (Signed)
 "  Established Patient Office Visit  Subjective   Patient ID: Jeremy Smith, male    DOB: [birth year]  Age: 59 y.o. MRN: [id number]  Chief Complaint  Patient presents with   Medication Refill   Back Pain    HPI Patient is a 59 year old male with PMH of chronic pain syndrome with chronic back pain, HLD, HTN that presents today with concerns of uncontrolled back pain, depressed mood and medication refills.  See problem based plan and assessment for more details.    ROS See problem based plan and assessment for more details.    Objective:     BP (!) 177/100 (BP Location: Right Arm, Patient Position: Sitting, Cuff Size: Normal)   Pulse 64   Temp 98.2 F (36.8 C) (Oral)   Ht 6' (1.829 m)   Wt 207 lb 3.2 oz (94 kg)   SpO2 100%   BMI 28.10 kg/m  BP Readings from Last 3 Encounters:  12/09/24 (!) 177/100  10/31/23 (!) 145/87  10/01/23 (!) 157/82      Physical Exam Constitutional:      General: He is not in acute distress.    Appearance: He is normal weight.  Cardiovascular:     Rate and Rhythm: Normal rate and regular rhythm.     Heart sounds: No murmur heard. Pulmonary:     Effort: Pulmonary effort is normal. No respiratory distress.     Breath sounds: No wheezing.  Musculoskeletal:     Right lower leg: No edema.     Left lower leg: No edema.  Neurological:     Mental Status: He is alert.     Gait: Gait normal.      No results found for any visits on 12/09/24.    The ASCVD Risk score (Arnett DK, et al., 2019) failed to calculate for the following reasons:   The valid total cholesterol range is 130 to 320 mg/dL    Assessment & Plan:   Problem List Items Addressed This Visit       Cardiovascular and Mediastinum   Hypertension     Other   Hyperlipidemia   Assessment & Plan Hyperlipidemia, unspecified hyperlipidemia type Patient needed refill on atorvastatin  20 mg. This medication was refilled  Primary hypertension Patient has been out of his blood  pressure medications for about 2 weeks. Patient states that he checks his blood pressures at home and they are usually around SBP 140s-150s. On amlodipine  10 mg and Hyzaar 50-12.5 mg. I think with this Bps still being elevated at home, can titrate up his Hyzaar dose  Plan: Continue amlodipine  10 mg  Increase hyzaar to 100-25 mg daily Continue checking blood pressure at home  BMP at next visit  Low back pain, unspecified back pain laterality, unspecified chronicity, unspecified whether sciatica present Patient has been seeing pain management for this. Has had previous back surgery in 2008 at L5-S1 disc patient reported. Patient has epidural steroid injections and facet injections. States that pain management is trying nerve ablations next. 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 Patient reports taking his gabapentin  with no relief. Patient has also been doing physical therapy exercises at home.   Plan:  Referral to neurosurgery for other options Continue working with pain management Continue physical therapy exercises  Continue gabapentin  600mg  TID  Depression, unspecified depression type Patient reports feeling depressed due to not  being able to be as active anymore. He works in holiday [employer] and used to be the breadwinner but unable to support his family at this time as he used to. PHQ-9 showed score of 7. Patient states that he does not like taking medications if not needed, but would like to talk to someone about his feelings.   Plan: Referral to [facility]  Healthcare maintenance Patient declined flu shot at this time due to his transportation picking him up imminently, but would like to get at his next appointment. Did encourage him that he could walk in for this as well.     No follow-ups on file.    Amandalee Lacap D'Mello, DO  "

## 2024-12-19 NOTE — Progress Notes (Signed)
 Internal Medicine Clinic Attending  Case discussed with the resident at the time of the visit.  We reviewed the resident's history and exam and pertinent patient test results.  I agree with the assessment, diagnosis, and plan of care documented in the resident's note.
